# Patient Record
Sex: Male | Born: 1940 | Race: Black or African American | Hispanic: No | Smoking: Current every day smoker
Health system: Southern US, Community
[De-identification: ages and names within clinical notes are randomized; demographics above are authoritative.]

## PROBLEM LIST (undated history)

## (undated) DIAGNOSIS — I10 Essential (primary) hypertension: Secondary | ICD-10-CM

---

## 2015-08-10 DIAGNOSIS — Z8673 Personal history of transient ischemic attack (TIA), and cerebral infarction without residual deficits: Secondary | ICD-10-CM

## 2017-03-06 DIAGNOSIS — E7800 Pure hypercholesterolemia, unspecified: Secondary | ICD-10-CM | POA: Diagnosis present

## 2020-12-04 ENCOUNTER — Emergency Department: Payer: Medicare Other

## 2020-12-04 ENCOUNTER — Other Ambulatory Visit: Payer: Self-pay

## 2020-12-04 ENCOUNTER — Observation Stay: Payer: Medicare Other

## 2020-12-04 ENCOUNTER — Inpatient Hospital Stay
Admission: EM | Admit: 2020-12-04 | Discharge: 2020-12-06 | DRG: 871 | Disposition: A | Discharge status: Home Health | Payer: Medicare Other | Attending: Family Medicine | Admitting: Family Medicine

## 2020-12-04 DIAGNOSIS — Z8673 Personal history of transient ischemic attack (TIA), and cerebral infarction without residual deficits: Secondary | ICD-10-CM

## 2020-12-04 DIAGNOSIS — A419 Sepsis, unspecified organism: Secondary | ICD-10-CM | POA: Diagnosis not present

## 2020-12-04 DIAGNOSIS — N390 Urinary tract infection, site not specified: Secondary | ICD-10-CM

## 2020-12-04 DIAGNOSIS — Z20822 Contact with and (suspected) exposure to covid-19: Secondary | ICD-10-CM | POA: Diagnosis present

## 2020-12-04 DIAGNOSIS — J189 Pneumonia, unspecified organism: Secondary | ICD-10-CM | POA: Diagnosis not present

## 2020-12-04 DIAGNOSIS — I10 Essential (primary) hypertension: Secondary | ICD-10-CM | POA: Diagnosis present

## 2020-12-04 DIAGNOSIS — E119 Type 2 diabetes mellitus without complications: Secondary | ICD-10-CM | POA: Diagnosis present

## 2020-12-04 DIAGNOSIS — I69311 Memory deficit following cerebral infarction: Secondary | ICD-10-CM

## 2020-12-04 DIAGNOSIS — R29818 Other symptoms and signs involving the nervous system: Secondary | ICD-10-CM

## 2020-12-04 DIAGNOSIS — E7800 Pure hypercholesterolemia, unspecified: Secondary | ICD-10-CM

## 2020-12-04 DIAGNOSIS — R531 Weakness: Secondary | ICD-10-CM

## 2020-12-04 DIAGNOSIS — E538 Deficiency of other specified B group vitamins: Secondary | ICD-10-CM | POA: Diagnosis present

## 2020-12-04 DIAGNOSIS — W19XXXA Unspecified fall, initial encounter: Secondary | ICD-10-CM | POA: Diagnosis present

## 2020-12-04 DIAGNOSIS — I69320 Aphasia following cerebral infarction: Secondary | ICD-10-CM

## 2020-12-04 DIAGNOSIS — E785 Hyperlipidemia, unspecified: Secondary | ICD-10-CM | POA: Diagnosis present

## 2020-12-04 DIAGNOSIS — H5462 Unqualified visual loss, left eye, normal vision right eye: Secondary | ICD-10-CM | POA: Diagnosis present

## 2020-12-04 DIAGNOSIS — F0150 Vascular dementia without behavioral disturbance: Secondary | ICD-10-CM | POA: Diagnosis present

## 2020-12-04 DIAGNOSIS — I69351 Hemiplegia and hemiparesis following cerebral infarction affecting right dominant side: Secondary | ICD-10-CM

## 2020-12-04 DIAGNOSIS — R4182 Altered mental status, unspecified: Secondary | ICD-10-CM

## 2020-12-04 DIAGNOSIS — I248 Other forms of acute ischemic heart disease: Secondary | ICD-10-CM | POA: Diagnosis present

## 2020-12-04 DIAGNOSIS — F17210 Nicotine dependence, cigarettes, uncomplicated: Secondary | ICD-10-CM | POA: Diagnosis present

## 2020-12-04 DIAGNOSIS — I639 Cerebral infarction, unspecified: Secondary | ICD-10-CM | POA: Diagnosis not present

## 2020-12-04 DIAGNOSIS — H409 Unspecified glaucoma: Secondary | ICD-10-CM | POA: Diagnosis present

## 2020-12-04 HISTORY — DX: Essential (primary) hypertension: I10

## 2020-12-04 LAB — CBC WITH DIFFERENTIAL/PLATELET
Abs Immature Granulocytes: 0.06 10*3/uL (ref 0.00–0.07)
Basophils Absolute: 0 10*3/uL (ref 0.0–0.1)
Basophils Relative: 0 %
Eosinophils Absolute: 0 10*3/uL (ref 0.0–0.5)
Eosinophils Relative: 0 %
HCT: 45.9 % (ref 39.0–52.0)
Hemoglobin: 15.3 g/dL (ref 13.0–17.0)
Immature Granulocytes: 1 %
Lymphocytes Relative: 19 %
Lymphs Abs: 2 10*3/uL (ref 0.7–4.0)
MCH: 30.1 pg (ref 26.0–34.0)
MCHC: 33.3 g/dL (ref 30.0–36.0)
MCV: 90.4 fL (ref 80.0–100.0)
Monocytes Absolute: 1.2 10*3/uL — ABNORMAL HIGH (ref 0.1–1.0)
Monocytes Relative: 11 %
Neutro Abs: 7.5 10*3/uL (ref 1.7–7.7)
Neutrophils Relative %: 69 %
Platelets: 212 10*3/uL (ref 150–400)
RBC: 5.08 MIL/uL (ref 4.22–5.81)
RDW: 14.3 % (ref 11.5–15.5)
WBC: 10.8 10*3/uL — ABNORMAL HIGH (ref 4.0–10.5)
nRBC: 0 % (ref 0.0–0.2)

## 2020-12-04 LAB — URINALYSIS, COMPLETE (UACMP) WITH MICROSCOPIC
Bilirubin Urine: NEGATIVE
Glucose, UA: NEGATIVE mg/dL
Ketones, ur: 20 mg/dL — AB
Nitrite: NEGATIVE
Protein, ur: 100 mg/dL — AB
Specific Gravity, Urine: 1.028 (ref 1.005–1.030)
pH: 5 (ref 5.0–8.0)

## 2020-12-04 LAB — COMPREHENSIVE METABOLIC PANEL
ALT: 15 U/L (ref 0–44)
AST: 34 U/L (ref 15–41)
Albumin: 3.8 g/dL (ref 3.5–5.0)
Alkaline Phosphatase: 58 U/L (ref 38–126)
Anion gap: 12 (ref 5–15)
BUN: 27 mg/dL — ABNORMAL HIGH (ref 8–23)
CO2: 24 mmol/L (ref 22–32)
Calcium: 9.7 mg/dL (ref 8.9–10.3)
Chloride: 99 mmol/L (ref 98–111)
Creatinine, Ser: 1.16 mg/dL (ref 0.61–1.24)
GFR, Estimated: >60 mL/min (ref >60)
Glucose, Bld: 95 mg/dL (ref 70–99)
Potassium: 4.2 mmol/L (ref 3.5–5.1)
Sodium: 135 mmol/L (ref 135–145)
Total Bilirubin: 1.4 mg/dL — ABNORMAL HIGH (ref 0.3–1.2)
Total Protein: 8 g/dL (ref 6.5–8.1)

## 2020-12-04 LAB — URINE DRUG SCREEN, QUALITATIVE (ARMC ONLY)
Amphetamines, Ur Screen: NOT DETECTED
Barbiturates, Ur Screen: NOT DETECTED
Benzodiazepine, Ur Scrn: NOT DETECTED
Cannabinoid 50 Ng, Ur ~~LOC~~: NOT DETECTED
Cocaine Metabolite,Ur ~~LOC~~: NOT DETECTED
MDMA (Ecstasy)Ur Screen: NOT DETECTED
Methadone Scn, Ur: NOT DETECTED
Opiate, Ur Screen: NOT DETECTED
Phencyclidine (PCP) Ur S: NOT DETECTED
Tricyclic, Ur Screen: NOT DETECTED

## 2020-12-04 LAB — TROPONIN I (HIGH SENSITIVITY)
Troponin I (High Sensitivity): 25 ng/L — ABNORMAL HIGH (ref <18)
Troponin I (High Sensitivity): 29 ng/L — ABNORMAL HIGH (ref <18)

## 2020-12-04 LAB — RESP PANEL BY RT-PCR (FLU A&B, COVID) ARPGX2
Influenza A by PCR: NEGATIVE
Influenza B by PCR: NEGATIVE
SARS Coronavirus 2 by RT PCR: NEGATIVE

## 2020-12-04 LAB — LACTIC ACID, PLASMA
Lactic Acid, Venous: 2.6 mmol/L (ref 0.5–1.9)
Lactic Acid, Venous: 2.6 mmol/L (ref 0.5–1.9)

## 2020-12-04 LAB — ETHANOL: Alcohol, Ethyl (B): <10 mg/dL (ref <10)

## 2020-12-04 MED ORDER — ASPIRIN EC 81 MG PO TBEC
81.0000 mg | DELAYED_RELEASE_TABLET | Freq: Every day | ORAL | Status: DC
  Administered 2020-12-05 – 2020-12-06 (×2): 81 mg via ORAL
  Filled 2020-12-04 (×2): qty 1

## 2020-12-04 MED ORDER — SODIUM CHLORIDE 0.9 % IV SOLN
2.0000 g | INTRAVENOUS | Status: DC
  Administered 2020-12-04 – 2020-12-05 (×2): 2 g via INTRAVENOUS
  Filled 2020-12-04 (×3): qty 20

## 2020-12-04 MED ORDER — LACTATED RINGERS IV SOLN: INTRAVENOUS | Status: DC

## 2020-12-04 MED ORDER — ACETAMINOPHEN 325 MG PO TABS: 650.0000 mg | ORAL_TABLET | Freq: Four times a day (QID) | ORAL | Status: DC | PRN

## 2020-12-04 MED ORDER — STROKE: EARLY STAGES OF RECOVERY BOOK: Freq: Once | Status: AC

## 2020-12-04 MED ORDER — ALBUTEROL SULFATE (2.5 MG/3ML) 0.083% IN NEBU: 2.5000 mg | INHALATION_SOLUTION | Freq: Four times a day (QID) | RESPIRATORY_TRACT | Status: DC | PRN

## 2020-12-04 MED ORDER — ASPIRIN 325 MG PO TABS
325.0000 mg | ORAL_TABLET | Freq: Once | ORAL | Status: AC
  Administered 2020-12-04: 325 mg via ORAL
  Filled 2020-12-04: qty 1

## 2020-12-04 MED ORDER — SODIUM CHLORIDE 0.9% FLUSH
3.0000 mL | Freq: Two times a day (BID) | INTRAVENOUS | Status: DC
  Administered 2020-12-04 – 2020-12-06 (×4): 3 mL via INTRAVENOUS

## 2020-12-04 MED ORDER — POLYETHYLENE GLYCOL 3350 17 G PO PACK: 17.0000 g | PACK | Freq: Every day | ORAL | Status: DC | PRN

## 2020-12-04 MED ORDER — LACTATED RINGERS IV BOLUS (SEPSIS)
1000.0000 mL | Freq: Once | INTRAVENOUS | Status: AC
  Administered 2020-12-04: 1000 mL via INTRAVENOUS

## 2020-12-04 MED ORDER — ENOXAPARIN SODIUM 40 MG/0.4ML ~~LOC~~ SOLN
40.0000 mg | SUBCUTANEOUS | Status: DC
  Administered 2020-12-04 – 2020-12-05 (×2): 40 mg via SUBCUTANEOUS
  Filled 2020-12-04 (×2): qty 0.4

## 2020-12-04 MED ORDER — ACETAMINOPHEN 650 MG RE SUPP: 650.0000 mg | Freq: Four times a day (QID) | RECTAL | Status: DC | PRN

## 2020-12-04 MED ORDER — SODIUM CHLORIDE 0.9 % IV SOLN
500.0000 mg | INTRAVENOUS | Status: DC
  Administered 2020-12-04 – 2020-12-05 (×2): 500 mg via INTRAVENOUS
  Filled 2020-12-04 (×3): qty 500

## 2020-12-04 NOTE — ED Triage Notes (Signed)
Patient arrived via EMS after unwitnessed fall "a few days" ago per family. Family states he is not acting his baseline which is normally A&Ox4. Per family patient has not been eating. Pt is weak on right side, equal smile, and following commands.

## 2020-12-04 NOTE — H&P (Addendum)
History and Physical   Dustin Peterson LNL:[id number] DOB: [birth year] DOA: 12/04/2020  PCP: Pcp, No   Patient coming from: Home  Chief Complaint: Weakness, change in behavior, recently found down  HPI: Dustin Peterson is a 80 y.o. male with medical history significant of stroke, glaucoma, left eye blindness, hypertension, hyperlipidemia who presents after acting abnormally and being found down recently.  Some history obtained with the assistance of family chart review.  Patient was found down several days ago after a presumed fall versus weakness leading him to be found down.  He has been altered somewhat and has had decreased p.o. intake. Per EMS he was unable to ambulate without assistance.  Earlier in his hospitalization he was reportedly not speaking and this is true for his family before he was hospitalized as well.  Per the ED provider he was speaking some but in a soft voice and preferring to nod or indicate information with his hands.  At the time of my exam patient was able to speak more and answer questions appropriately.  He now states that he was not speaking as much earlier not because he did not want to but because he was having trouble getting the words out.  He also reports after discussion that he has noticed his right side is weaker than his left.  He and family are unsure but thinks that this is different from his previous stroke deficits.  He is also now able to tell me that he did fall on his back several days ago.  Family confirms that he had not mentioned that until now.  Reports some urinary frequency.  Denies dysuria. Denies fevers, chills, chest pain, shortness of breath, abdominal pain, constipation, diarrhea, nausea, vomiting.  ED Course: Vital signs in ED significant for initial heart rate of 100 on EKG with improvement in ED.  Initial respiratory rate in the 20s with improvement in the ED.  Lab work-up with BMP with BUN 27, T bili 1.4.  CBC with leukocytosis to 10.8.   Troponin elevated to 25 with repeat pending.  Lactic acid elevated 2.6 with repeat pending.  Respiratory panel for flu and COVID pending.  Urinalysis with leukocytes, bacteria, white cells, hemoglobin.  UDS negative.  Ethanol level pending.  This x-ray showed chronic ILD changes, mild left basilar atelectasis versus infiltrate.  CT head showed no acute disease but showed small chronic lacunar infarcts.  CT C-spine showed no acute disease but did show severe degenerative disease at C5-C6.  Blood cultures pending.  Patient received 1 L IV fluids, ceftriaxone, azithromycin in ED.  Review of Systems: As per HPI otherwise all other systems reviewed and are negative.  Past Medical History:  Diagnosis Date  . Hypertension     History reviewed. No pertinent surgical history.  Social History  reports that he has been smoking cigarettes. He has been smoking about 1.00 pack per day. He has never used smokeless tobacco. He reports previous alcohol use. He reports that he does not use drugs.  No Known Allergies  Family History  Problem Relation Age of Onset  . Colon cancer Mother   Reviewed on admission  Prior to Admission medications   Not on File  Lipitor 40 mg daily Latanoprost eyedrops both eyes nightly  Physical Exam: Vitals:   12/04/20 1924 12/04/20 1927 12/04/20 1929 12/04/20 2030  BP:  105/86  111/86  Pulse:  98  88  Resp:  (!) 25  19  Temp:  97.6 F (36.4 C)  TempSrc:  Axillary    SpO2: 97% 98%  100%  Weight:   58.8 kg   Height:   5\' 9"  (1.753 m)    Physical Exam Constitutional:      General: He is not in acute distress.    Appearance: Normal appearance.  HENT:     Head: Normocephalic and atraumatic.     Mouth/Throat:     Mouth: Mucous membranes are moist.     Pharynx: Oropharynx is clear.  Eyes:     Extraocular Movements: Extraocular movements intact.     Pupils: Pupils are equal, round, and reactive to light.  Cardiovascular:     Rate and Rhythm: Normal rate and  regular rhythm.     Pulses: Normal pulses.     Heart sounds: Normal heart sounds.  Pulmonary:     Effort: Pulmonary effort is normal. No respiratory distress.     Breath sounds: Normal breath sounds.  Abdominal:     General: Bowel sounds are normal. There is no distension.     Palpations: Abdomen is soft.     Tenderness: There is abdominal tenderness in the suprapubic area.  Musculoskeletal:        General: No swelling or deformity.  Skin:    General: Skin is warm and dry.  Neurological:     Mental Status: Mental status is at baseline.     Comments: Mental Status: Patient is awake, alert, oriented Ha had early evidence of aphasia based on prior providers exam where he was saying minimal words.  He reports that that was because he did not want to speak but because he was having trouble getting words out.  No signs of neglect  Cranial Nerves: II: R Pupil equal, round, and reactive to light.  Chronic left eye blindness with glaucoma. III,IV, VI: EOMI without ptosis or diploplia.  V: Facial sensation is symmetric tolight touch. VII: Facial movement is symmetric.  VIII: hearing is intact to voice X: Uvula elevates symmetrically XI: Shoulder shrug is symmetric. XII: tongue is midline without atrophy or fasciculations.  Motor: Good effort thorughout, 4-5 out of 5 left upper and left lower extremities.  3-4 out of 5 right upper and right lower extremities. Sensory: Sensation is grossly intact bilateral UEs & LE Cerebellar: Finger-Nose mildly positive bilaterally this may be due to his left eye blindness.     Labs on Admission: I have personally reviewed following labs and imaging studies  CBC: Recent Labs  Lab 12/04/20 1935  WBC 10.8*  NEUTROABS 7.5  HGB 15.3  HCT 45.9  MCV 90.4  PLT 212    Basic Metabolic Panel: Recent Labs  Lab 12/04/20 1935  NA 135  K 4.2  CL 99  CO2 24  GLUCOSE 95  BUN 27*  CREATININE 1.16  CALCIUM 9.7    GFR: Estimated Creatinine  Clearance: 41.5 mL/min (by C-G formula based on SCr of 1.16 mg/dL).  Liver Function Tests: Recent Labs  Lab 12/04/20 1935  AST 34  ALT 15  ALKPHOS 58  BILITOT 1.4*  PROT 8.0  ALBUMIN 3.8    Urine analysis:    Component Value Date/Time   COLORURINE AMBER (A) 12/04/2020 1935   APPEARANCEUR HAZY (A) 12/04/2020 1935   LABSPEC 1.028 12/04/2020 1935   PHURINE 5.0 12/04/2020 1935   GLUCOSEU NEGATIVE 12/04/2020 1935   HGBUR MODERATE (A) 12/04/2020 1935   BILIRUBINUR NEGATIVE 12/04/2020 1935   KETONESUR 20 (A) 12/04/2020 1935   PROTEINUR 100 (A) 12/04/2020 1935   NITRITE  NEGATIVE 12/04/2020 1935   LEUKOCYTESUR SMALL (A) 12/04/2020 1935    Radiological Exams on Admission: CT Head Wo Contrast  Result Date: 12/04/2020 CLINICAL DATA:  Status post fall. EXAM: CT HEAD WITHOUT CONTRAST TECHNIQUE: Contiguous axial images were obtained from the base of the skull through the vertex without intravenous contrast. COMPARISON:  None. FINDINGS: Brain: There is mild to moderate severity cerebral atrophy with widening of the extra-axial spaces and ventricular dilatation. There are areas of decreased attenuation within the white matter tracts of the supratentorial brain, consistent with microvascular disease changes. A small chronic left basal ganglia lacunar infarct is seen. Vascular: No hyperdense vessel or unexpected calcification. Skull: Normal. Negative for fracture or focal lesion. Sinuses/Orbits: There is mild right maxillary sinus mucosal thickening. Other: None. IMPRESSION: 1. Generalized cerebral atrophy. 2. Small chronic left basal ganglia lacunar infarct. 3. No acute intracranial abnormality. Electronically Signed   By: Aram Candelahaddeus  Houston M.D.   On: 12/04/2020 20:14   CT Cervical Spine Wo Contrast  Result Date: 12/04/2020 CLINICAL DATA:  Status post trauma. EXAM: CT CERVICAL SPINE WITHOUT CONTRAST TECHNIQUE: Multidetector CT imaging of the cervical spine was performed without intravenous  contrast. Multiplanar CT image reconstructions were also generated. COMPARISON:  None. FINDINGS: Alignment: Normal. Skull base and vertebrae: No acute fracture. Chronic changes are seen involving the body and tip of the dens. No primary bone lesion or focal pathologic process. Soft tissues and spinal canal: No prevertebral fluid or swelling. No visible canal hematoma. Disc levels: Mild anterior osteophyte formation is seen at the level of C6-C7. There is moderate to marked severity endplate sclerosis at the level of C5-C6. There is marked severity narrowing of the anterior atlantoaxial articulation. Moderate to marked severity intervertebral disc space narrowing is seen at the level of C5-C6. Bilateral, mild-to-moderate severity multilevel facet joint hypertrophy is noted. Upper chest: Mild atelectasis is seen within the left apex. Other: None. IMPRESSION: 1. Moderate to marked severity degenerative changes at the level of C5-C6. 2. No evidence of an acute fracture or subluxation. Electronically Signed   By: Aram Candelahaddeus  Houston M.D.   On: 12/04/2020 20:18   DG Chest Port 1 View  Result Date: 12/04/2020 CLINICAL DATA:  Status post fall. EXAM: PORTABLE CHEST 1 VIEW COMPARISON:  None. FINDINGS: Diffuse, chronic appearing increased interstitial lung markings are seen, bilaterally. Mild left basilar atelectasis and/or infiltrate is also seen. There is no evidence of a pleural effusion or pneumothorax. The heart size and mediastinal contours are within normal limits. The visualized skeletal structures are unremarkable. IMPRESSION: 1. Chronic interstitial lung disease with mild left basilar atelectasis and/or infiltrate. Electronically Signed   By: Aram Candelahaddeus  Houston M.D.   On: 12/04/2020 20:27    EKG: Independently reviewed.  Sinus tachycardia 100 bpm.  Assessment/Plan Principal Problem:   Acute CVA (cerebrovascular accident) [facility](HCC) Active Problems:   Sepsis (HCC)   History of CVA (cerebrovascular accident)    Pure hypercholesterolemia   CAP (community acquired pneumonia)   Acute lower UTI  Stroke vs TIA Fall HLD > Right-sided weakness, aphasia, abnormal finger-nose test > Patient's ability to speak is improved since has been in the ED and he states that he was having trouble getting out his words earlier and that was the reason he was communicating less not that he did not want to. > He has had a stroke previously with evidence on CT of a chronic infarct but no acute hemorrhagic stroke.  He and family states that his deficits at that time were different  from these. > He was able to clarify on my exam but he did have a fall several days ago where he fell backwards per his report. - Neurology consult in the AM - Allow for permissive HTN in the setting of systolic < 220 and diastolic < 120 - ASA 325 mg / 81 mg daily  - Continue home Statin  - Echocardiogram  - Carotid doppler  - A1C  - Lipid panel  - Tele monitoring  - SLP eval - PT/OT  Sepsis UTI versus Pneumonia > Some of his vital abnormalities and weakness may be due to stroke as above versus sepsis. > Noted to have leukocytosis to 10.8 in ED.  Chest x-ray showed mild left basilar atelectasis first infiltrate.  Urinalysis with leukocytes bacteria white cells and hemoglobin. > Meets sepsis criteria considering leukocytosis to 10.8, initial tachycardia at 100 and initial respirations in the 20s with lactic acid 2.6.  Blood pressure stable in ED. > No dysuria but does report increased urinary frequency and has suprapubic tenderness on exam. - Continue ceftriaxone and azithromycin - Continue IV fluids - Follow-up for repeat blood cultures - Trend fever curve and WBC  History of CVA Hyperlipidemia - Continue home Lipitor  Left eye blindness Glaucoma - Continue home latanoprost eyedrops  DVT prophylaxis: Lovenox  Code Status:   Full Family Communication:  Family updated at bedside  Disposition Plan:   Patient is  from:  Home  Anticipated DC to:  Pending evaluation  Anticipated DC date:  1 to 3 days  Anticipated DC barriers: None  Consults called:  None  Admission status:  Observation, progressive  Severity of Illness: The appropriate patient status for this patient is OBSERVATION. Observation status is judged to be reasonable and necessary in order to provide the required intensity of service to ensure the patient's safety. The patient's presenting symptoms, physical exam findings, and initial radiographic and laboratory data in the context of their medical condition is felt to place them at decreased risk for further clinical deterioration. Furthermore, it is anticipated that the patient will be medically stable for discharge from the hospital within 2 midnights of admission. The following factors support the patient status of observation.   " The patient's presenting symptoms include weakness, decreased p.o. intake, found down earlier this week, later noted to have aphasia and right-sided deficits. " The physical exam findings include right-sided weakness, abnormal finger-nose. " The initial radiographic and laboratory data are leukocytosis 2.8, chest x-ray with mild left basilar atelectasis versus infiltrate, urinalysis with leukocytes, bacteria, white blood cells, hemoglobin.  Initial troponin  25, initial lactic acid 2.6..   Synetta Fail MD [facility]  How to contact the [facility] Attending or Consulting provider 7A - 7P or covering provider during after hours 7P -7A, for this patient?   1. Check the care team in [employer] and look for a) attending/consulting [facility] provider listed and b) the [facility] team listed 2. Log into [email or web address] and use 's universal password to access. If you do not have the password, please contact the hospital operator. 3. Locate the [facility] provider you are looking for under [facility] and page to a number that you can be directly reached. 4. If you still  have difficulty reaching the provider, please page the [facility] (Director on Call) for the Hospitalists listed on amion for assistance.  12/04/2020, 10:03 PM

## 2020-12-04 NOTE — ED Provider Notes (Signed)
[facility] Emergency Department Provider Note  ____________________________________________   Event Date/Time   First MD Initiated Contact with Patient 12/04/20 1927     (approximate)  I have reviewed the triage vital signs and the nursing notes.   HISTORY  Chief Complaint Fall   HPI Dustin Peterson is a 80 y.o. male is brought to the ED via the EMS with complaint of a unwitnessed fall approximately 2 days ago.  Family told EMS that he is not "acting right".  Patient also is not eating or drinking normally.  Patient denies any pain, nausea or vomiting.  Patient admits to smoking daily but denies any use of alcohol or drugs.  EMS reports also that patient was unable to ambulate with assistance.  EMS reports that family is on the way.  History is obtained with patient giving head nods and use of fingers to say how many days it has been since he ate or drank.  He answers that is been 3 days since he ate but had a small amount of Pepsi today.  When asked about eating he nods yes to he has no appetite but that there is food there.       Past Medical History:  Diagnosis Date  . Hypertension     There are no problems to display for this patient.   Prior to Admission medications   Not on File    Allergies Patient has no known allergies.  History reviewed. No pertinent family history.  Social History Social History   Tobacco Use  . Smoking status: Current Every Day Smoker    Packs/day: 1.00    Types: Cigarettes  . Smokeless tobacco: Never Used  Substance Use Topics  . Alcohol use: Not Currently  . Drug use: Never    Review of Systems Constitutional: No fever/chills.  Denies dizziness. Eyes: No visual changes. ENT: Denies sore throat. Cardiovascular: Denies chest pain. Respiratory: Denies shortness of breath. Gastrointestinal: No abdominal pain.  No nausea, no vomiting.  No diarrhea.   Genitourinary: Negative for dysuria. Musculoskeletal:   denies musculoskeletal pain.  Negative for back pain. Skin: Negative for rash.  Neurological: Negative for headaches, focal weakness or numbness.   ____________________________________________   PHYSICAL EXAM:  VITAL SIGNS: ED Triage Vitals [12/04/20 1924]  Enc Vitals Group     BP      Pulse      Resp      Temp      Temp src      SpO2 97 %     Weight      Height      Head Circumference      Peak Flow      Pain Score      Pain Loc      Pain Edu?      Excl. in GC?     Constitutional: Alert and oriented. Well appearing and in no acute distress.  Patient with minimal verbalization but answers questions with nods and also uses fingers to answer questions. Eyes: Right conjunctivae are normal. PERRL. EOMI.  Head: Atraumatic.  No abrasions, lacerations or soft tissue edema present. Nose: No trauma noted. Mouth/Throat: No trauma.  Teeth are in very poor repair and hygiene. Neck: No stridor.  No tenderness on palpation of cervical spine posteriorly. Cardiovascular: Normal rate, regular rhythm. Grossly normal heart sounds.  Good peripheral circulation. Respiratory: Normal respiratory effort.  No retractions. Lungs CTAB.  Poor deep inspiration attempts. Gastrointestinal: Soft and nontender. No distention.  Musculoskeletal: Patient is able move upper and lower extremities.  He has good hand strength and lower leg strength.  No injury, skin discoloration or abrasions are noted.  He denies any tenderness on palpation of the thoracic or lumbar spine.  No tenderness with compression of the hips and no rotation or shortening of extremities is noted.  No edema noted lower extremities. Neurologic:  Normal speech and language.  Cranial nerves II through XII grossly intact.  No gross focal neurologic deficits are appreciated. No gait instability. Skin:  Skin is warm, dry and intact.  No discoloration or abrasions are noted. Psychiatric: Mood and affect are normal. Speech and behavior are  normal.  ____________________________________________   LABS (all labs ordered are listed, but only abnormal results are displayed)  Labs Reviewed  CBC WITH DIFFERENTIAL/PLATELET - Abnormal; Notable for the following components:      Result Value   WBC 10.8 (*)    Monocytes Absolute 1.2 (*)    All other components within normal limits  LACTIC ACID, PLASMA - Abnormal; Notable for the following components:   Lactic Acid, Venous 2.6 (*)    All other components within normal limits  COMPREHENSIVE METABOLIC PANEL  URINALYSIS, COMPLETE (UACMP) WITH MICROSCOPIC  LACTIC ACID, PLASMA  URINE DRUG SCREEN, QUALITATIVE ([facility] ONLY)  ETHANOL  TROPONIN I (HIGH SENSITIVITY)   ____________________________________________  EKG   ____________________________________________  RADIOLOGY Beaulah Corin, personally viewed and evaluated these images (plain radiographs) as part of my medical decision making, as well as reviewing the written report by the radiologist.  ED MD interpretation:    Official radiology report(s): No results found.  ____________________________________________   PROCEDURES  Procedure(s) performed (including Critical Care):  Procedures   ____________________________________________   INITIAL IMPRESSION / ASSESSMENT AND PLAN / ED COURSE  As part of my medical decision making, I reviewed the following data within the electronic MEDICAL RECORD NUMBER Notes from prior ED visits and [place]rolled Sub[place] Database  ----------------------------------------- 8:07 PM on 12/04/2020 ----------------------------------------- Lab work is pending.  No medical records prior to this visit is available at [facility], [facility] or [facility].  Care of this patient is being turned over to Dr. Donna Bernard at this time.  He is aware of work up in process.     ____________________________________________   FINAL CLINICAL IMPRESSION(S) / ED DIAGNOSES  Final diagnoses:  None     ED  Discharge Orders    None      *Please note:  Grove Defina was evaluated in Emergency Department on 12/04/2020 for the symptoms described in the history of present illness. He was evaluated in the context of the global COVID-19 pandemic, which necessitated consideration that the patient might be at risk for infection with the SARS-CoV-2 virus that causes COVID-19. Institutional protocols and algorithms that pertain to the evaluation of patients at risk for COVID-19 are in a state of rapid change based on information released by regulatory bodies including the CDC and federal and state organizations. These policies and algorithms were followed during the patient's care in the ED.  Some ED evaluations and interventions may be delayed as a result of limited staffing during and the pandemic.*   Note:  This document was prepared using Dragon voice recognition software and may include unintentional dictation errors.    Tommi Rumps, PA-C 12/07/20 1009    Merwyn Katos, MD 12/08/20 [phone]

## 2020-12-04 NOTE — Consult Note (Signed)
CODE SEPSIS - PHARMACY COMMUNICATION  **Broad Spectrum Antibiotics should be administered within 1 hour of Sepsis diagnosis**  Time Code Sepsis Called/Page Received: 2055  Antibiotics Ordered: 2052  Time of 1st antibiotic administration: 2109  Additional action taken by pharmacy: N/A  If necessary, Name of Provider/Nurse Contacted: N/A    Derrek Gu ,PharmD Clinical Pharmacist  12/04/2020  8:54 PM

## 2020-12-04 NOTE — Progress Notes (Signed)
Elink following for Sepsis Protocol 

## 2020-12-05 ENCOUNTER — Observation Stay (HOSPITAL_COMMUNITY)
Admit: 2020-12-05 | Discharge: 2020-12-05 | Disposition: A | Discharge status: Home / Self Care | Payer: Medicare Other | Attending: Internal Medicine | Admitting: Internal Medicine

## 2020-12-05 ENCOUNTER — Observation Stay: Payer: Medicare Other

## 2020-12-05 DIAGNOSIS — H5462 Unqualified visual loss, left eye, normal vision right eye: Secondary | ICD-10-CM | POA: Diagnosis present

## 2020-12-05 DIAGNOSIS — R531 Weakness: Secondary | ICD-10-CM

## 2020-12-05 DIAGNOSIS — I69311 Memory deficit following cerebral infarction: Secondary | ICD-10-CM | POA: Diagnosis not present

## 2020-12-05 DIAGNOSIS — H409 Unspecified glaucoma: Secondary | ICD-10-CM | POA: Diagnosis present

## 2020-12-05 DIAGNOSIS — Z20822 Contact with and (suspected) exposure to covid-19: Secondary | ICD-10-CM | POA: Diagnosis present

## 2020-12-05 DIAGNOSIS — F17210 Nicotine dependence, cigarettes, uncomplicated: Secondary | ICD-10-CM | POA: Diagnosis present

## 2020-12-05 DIAGNOSIS — I248 Other forms of acute ischemic heart disease: Secondary | ICD-10-CM | POA: Diagnosis present

## 2020-12-05 DIAGNOSIS — E538 Deficiency of other specified B group vitamins: Secondary | ICD-10-CM | POA: Diagnosis present

## 2020-12-05 DIAGNOSIS — I6389 Other cerebral infarction: Secondary | ICD-10-CM

## 2020-12-05 DIAGNOSIS — I69320 Aphasia following cerebral infarction: Secondary | ICD-10-CM | POA: Diagnosis not present

## 2020-12-05 DIAGNOSIS — I10 Essential (primary) hypertension: Secondary | ICD-10-CM | POA: Diagnosis present

## 2020-12-05 DIAGNOSIS — R29818 Other symptoms and signs involving the nervous system: Secondary | ICD-10-CM | POA: Diagnosis present

## 2020-12-05 DIAGNOSIS — F0150 Vascular dementia without behavioral disturbance: Secondary | ICD-10-CM | POA: Diagnosis present

## 2020-12-05 DIAGNOSIS — A419 Sepsis, unspecified organism: Secondary | ICD-10-CM | POA: Diagnosis present

## 2020-12-05 DIAGNOSIS — E119 Type 2 diabetes mellitus without complications: Secondary | ICD-10-CM | POA: Diagnosis present

## 2020-12-05 DIAGNOSIS — E785 Hyperlipidemia, unspecified: Secondary | ICD-10-CM | POA: Diagnosis present

## 2020-12-05 DIAGNOSIS — J189 Pneumonia, unspecified organism: Secondary | ICD-10-CM | POA: Diagnosis present

## 2020-12-05 DIAGNOSIS — W19XXXA Unspecified fall, initial encounter: Secondary | ICD-10-CM | POA: Diagnosis present

## 2020-12-05 DIAGNOSIS — I639 Cerebral infarction, unspecified: Secondary | ICD-10-CM | POA: Diagnosis not present

## 2020-12-05 DIAGNOSIS — I69351 Hemiplegia and hemiparesis following cerebral infarction affecting right dominant side: Secondary | ICD-10-CM | POA: Diagnosis not present

## 2020-12-05 LAB — LACTIC ACID, PLASMA
Lactic Acid, Venous: 1.6 mmol/L (ref 0.5–1.9)
Lactic Acid, Venous: 1.3 mmol/L (ref 0.5–1.9)

## 2020-12-05 LAB — PROTIME-INR
INR: 1.2 (ref 0.8–1.2)
Prothrombin Time: 15.1 seconds (ref 11.4–15.2)

## 2020-12-05 LAB — CBC
HCT: 36.8 % — ABNORMAL LOW (ref 39.0–52.0)
Hemoglobin: 12.2 g/dL — ABNORMAL LOW (ref 13.0–17.0)
MCH: 30.5 pg (ref 26.0–34.0)
MCHC: 33.2 g/dL (ref 30.0–36.0)
MCV: 92 fL (ref 80.0–100.0)
Platelets: 166 10*3/uL (ref 150–400)
RBC: 4 MIL/uL — ABNORMAL LOW (ref 4.22–5.81)
RDW: 14.2 % (ref 11.5–15.5)
WBC: 7.4 10*3/uL (ref 4.0–10.5)
nRBC: 0 % (ref 0.0–0.2)

## 2020-12-05 LAB — HEMOGLOBIN A1C
Hgb A1c MFr Bld: 5.5 % (ref 4.8–5.6)
Mean Plasma Glucose: 111.15 mg/dL

## 2020-12-05 LAB — COMPREHENSIVE METABOLIC PANEL
ALT: 10 U/L (ref 0–44)
AST: 21 U/L (ref 15–41)
Albumin: 2.9 g/dL — ABNORMAL LOW (ref 3.5–5.0)
Alkaline Phosphatase: 45 U/L (ref 38–126)
Anion gap: 10 (ref 5–15)
BUN: 24 mg/dL — ABNORMAL HIGH (ref 8–23)
CO2: 26 mmol/L (ref 22–32)
Calcium: 8.6 mg/dL — ABNORMAL LOW (ref 8.9–10.3)
Chloride: 101 mmol/L (ref 98–111)
Creatinine, Ser: 0.93 mg/dL (ref 0.61–1.24)
GFR, Estimated: >60 mL/min (ref >60)
Glucose, Bld: 79 mg/dL (ref 70–99)
Potassium: 3.6 mmol/L (ref 3.5–5.1)
Sodium: 137 mmol/L (ref 135–145)
Total Bilirubin: 0.9 mg/dL (ref 0.3–1.2)
Total Protein: 5.9 g/dL — ABNORMAL LOW (ref 6.5–8.1)

## 2020-12-05 LAB — ECHOCARDIOGRAM COMPLETE
AR max vel: 3.12 cm2
AV Area VTI: 3.44 cm2
AV Area mean vel: 2.97 cm2
AV Mean grad: 4 mmHg
AV Peak grad: 6.4 mmHg
Ao pk vel: 1.26 m/s
Area-P 1/2: 2.5 cm2
Height: 69 in
MV VTI: 3.04 cm2
S' Lateral: 1.8 cm
Weight: 2074.09 oz

## 2020-12-05 LAB — LIPID PANEL
Cholesterol: 177 mg/dL (ref 0–200)
HDL: 40 mg/dL — ABNORMAL LOW (ref >40)
LDL Cholesterol: 118 mg/dL — ABNORMAL HIGH (ref 0–99)
Total CHOL/HDL Ratio: 4.4 RATIO
Triglycerides: 94 mg/dL (ref <150)
VLDL: 19 mg/dL (ref 0–40)

## 2020-12-05 NOTE — Evaluation (Signed)
Physical Therapy Evaluation Patient Details Name: Dustin Peterson MRN: [id number] DOB: [birth year] Today's Date: 12/05/2020   History of Present Illness  Dustin Peterson is a 80 y.o. male with medical history significant of stroke, glaucoma, left eye blindness, hypertension, hyperlipidemia who presents after acting abnormally and being found down recently. He has been altered somewhat and has had decreased p.o. intake.    Clinical Impression  Patient received in bed, daughter at bedside. Patient agreeable to PT session. Daughter reports he is usually on couch at home and does not do a lot of moving around. He is mod independent with bed mobility, transfers with min assist. Patient ambulated 35 feet with RW. Cues for staying close to AD. Patient will continue to benefit from skilled PT while here to improve strength and functional independence.      Follow Up Recommendations Home health PT;Supervision for mobility/OOB    Equipment Recommendations  Rolling walker with 5" wheels (( he may have one))    Recommendations for Other Services       Precautions / Restrictions Precautions Precautions: Fall Restrictions Weight Bearing Restrictions: No      Mobility  Bed Mobility Overal bed mobility: Modified Independent Bed Mobility: Supine to Sit;Sit to Supine     Supine to sit: Modified independent (Device/Increase time) Sit to supine: Modified independent (Device/Increase time)        Transfers Overall transfer level: Needs assistance Equipment used: Rolling walker (2 wheeled) Transfers: Sit to/from Stand Sit to Stand: Min assist        Lateral/Scoot Transfers: Supervision General transfer comment: Patient requires min assist for sit to stand. Initial standing balance.  Ambulation/Gait Ambulation/Gait assistance: Min guard Gait Distance (Feet): 35 Feet Assistive device: Rolling walker (2 wheeled) Gait Pattern/deviations: Step-through pattern;Decreased step length -  right;Decreased step length - left;Shuffle Gait velocity: decr   General Gait Details: Patient requires cues to stay close to RW due to weak LEs. No lob with ambulation, fatigues and wants to return to room after about 15 feet.  Stairs            Wheelchair Mobility    Modified Rankin (Stroke Patients Only)       Balance Overall balance assessment: Needs assistance Sitting-balance support: Feet supported Sitting balance-Leahy Scale: Good     Standing balance support: Bilateral upper extremity supported;During functional activity Standing balance-Leahy Scale: Fair Standing balance comment: requires BUE support                             Pertinent Vitals/Pain Pain Assessment: No/denies pain    Home Living Family/patient expects to be discharged to:: Private residence Living Arrangements: Alone Available Help at Discharge: Family;Available PRN/intermittently Type of Home: Mobile home Home Access: Stairs to enter Entrance Stairs-Rails: Left;Right;Can reach both Entrance Stairs-Number of Steps: 5 Home Layout: One level Home Equipment: Cane - single point;Shower seat      Prior Function Level of Independence: Independent with assistive device(s)         Comments: Pt reports 1 fall on Friday, denies prior falls. Pt is limited household ambulator using SPC, assist from children for IADLs ( Daughter present for PT session. Reports her father is always on the couch. Does not do much in the way of mobility.)     Hand Dominance        Extremity/Trunk Assessment   Upper Extremity Assessment Upper Extremity Assessment: Defer to OT evaluation    Lower Extremity Assessment Lower  Extremity Assessment: Generalized weakness    Cervical / Trunk Assessment Cervical / Trunk Assessment: Normal  Communication   Communication: No difficulties  Cognition Arousal/Alertness: Awake/alert Behavior During Therapy: WFL for tasks assessed/performed Overall  Cognitive Status: Within Functional Limits for tasks assessed                                        General Comments      Exercises Other Exercises Other Exercises: Pt educated re: OT role, DME recs, d/c recs, falls prevention, ECS Other Exercises: LBD, sup<>sit, lateral scoot, sit<>stand, sitting/standing balance/tolerance   Assessment/Plan    PT Assessment Patient needs continued PT services  PT Problem List Decreased strength;Decreased activity tolerance;Decreased balance;Decreased mobility;Decreased safety awareness       PT Treatment Interventions DME instruction;Therapeutic exercise;Gait training;Balance training;Stair training;Functional mobility training;Therapeutic activities;Patient/family education    PT Goals (Current goals can be found in the Care Plan section)  Acute Rehab PT Goals Patient Stated Goal: planning to stay with daughter at discharge PT Goal Formulation: With patient/family Time For Goal Achievement: 12/12/20 Potential to Achieve Goals: Good    Frequency Min 2X/week   Barriers to discharge Decreased caregiver support      Co-evaluation               AM-PAC PT "6 Clicks" Mobility  Outcome Measure Help needed turning from your back to your side while in a flat bed without using bedrails?: None Help needed moving from lying on your back to sitting on the side of a flat bed without using bedrails?: A Little Help needed moving to and from a bed to a chair (including a wheelchair)?: A Little Help needed standing up from a chair using your arms (e.g., wheelchair or bedside chair)?: A Little Help needed to walk in hospital room?: A Little Help needed climbing 3-5 steps with a railing? : A Little 6 Click Score: 19    End of Session   Activity Tolerance: Patient tolerated treatment well;Patient limited by fatigue Patient left: in bed;with call bell/phone within reach;with bed alarm set;with family/visitor present Nurse  Communication: Mobility status PT Visit Diagnosis: Muscle weakness (generalized) (M62.81);Difficulty in walking, not elsewhere classified (R26.2);History of falling (Z91.81)    Time: 1045-1100 PT Time Calculation (min) (ACUTE ONLY): 15 min   Charges:   PT Evaluation $PT Eval Moderate Complexity: 1 Mod          River Mckercher, PT, GCS 12/05/20,11:40 AM

## 2020-12-05 NOTE — Progress Notes (Addendum)
PROGRESS NOTE    Dustin Peterson  TMH:[id number] DOB: [birth year] DOA: 12/04/2020 PCP: Pcp, No     Brief Narrative:  Dustin Peterson is an 80 year old male with past medical history significant for left-sided stroke, glaucoma, left eye blindness, hyperlipidemia, hypertension who presents with weakness, found down at home several days ago.  Per report, patient was found down several days ago after a presumed fall, has had some confusion and decreased p.o. intake.  Per EMS, patient was unable to ambulate without assistance which is not his baseline.  He had also reported difficulty with his speech, right-sided weakness.  MRI brain was negative for acute stroke.  New events last 24 hours / Subjective: Patient had just finished working with OT, ambulated around the room using a walker.  Patient without any acute complaints, denied cough, although had coughing during my examination later on, denied dyspnea or chest pain.  Denied any nausea, vomiting or dysuria.  Assessment & Plan:   Principal Problem:   Weakness Active Problems:   Sepsis (HCC)   History of CVA (cerebrovascular accident)   Pure hypercholesterolemia   CAP (community acquired pneumonia)   Acute lower UTI   Acute on chronic right-sided weakness Hx CVA  -Per chart review, it appears the patient was admitted in July 2013 at [facility] with a stroke.  At that time, he had had left-sided stroke with right-sided weakness -CT head negative for acute intracranial abnormality -MRI brain without acute intracranial abnormality.  He does have a chronic left MCA infarct with underlying moderately advanced cerebral atrophy, chronic small vessel ischemic disease -Seems to be resolved and back to his baseline this morning. Could be worsening of residual weakness from previous stroke in setting of acute infectious process  -Continue aspirin  -PT OT   Sepsis secondary to left-sided pneumonia -Sepsis present on admission with HR 98, RR 25, WBC 10.8,  lactic acid 2.6 -Chest x-ray revealed chronic interstitial lung disease with mild left basilar atelectasis and/or infiltrate -He was started on Rocephin, azithromycin -Leukocytosis resolved, lactic acidosis resolved -Negative for Covid, influenza  Abnormal UA -Without symptoms of dysuria -Urine culture pending -On Rocephin as above  Falls -PT OT recommending home health.  Daughter states that she will take him home and he will be able to have 24-hour supervision at her house  Demand ischemia -Troponin 25 --> 29   DVT prophylaxis:  enoxaparin (LOVENOX) injection 40 mg Start: 12/04/20 2200  Code Status: Full code Family Communication: Daughter at bedside Disposition Plan:  Status is: Observation  The patient will require care spanning > 2 midnights and should be moved to inpatient because: IV treatments appropriate due to intensity of illness or inability to take PO  Dispo: The patient is from: Home              Anticipated d/c is to: Home              Patient currently is not medically stable to d/c.    Difficult to place patient No      Antimicrobials:  Anti-infectives (From admission, onward)   Start     Dose/Rate Route Frequency Ordered Stop   12/04/20 2100  cefTRIAXone (ROCEPHIN) 2 g in sodium chloride 0.9 % 100 mL IVPB        2 g 200 mL/hr over 30 Minutes Intravenous Every 24 hours 12/04/20 2052     12/04/20 2100  azithromycin (ZITHROMAX) 500 mg in sodium chloride 0.9 % 250 mL IVPB  500 mg 250 mL/hr over 60 Minutes Intravenous Every 24 hours 12/04/20 2052          Objective: Vitals:   12/04/20 2230 12/04/20 2255 12/05/20 0500 12/05/20 0900  BP: 124/82 128/84 120/71 124/82  Pulse: 77 86 60 60  Resp: (!) Temp:  98.8 F (37.1 C) 97.8 F (36.6 C) 98 F (36.7 C)  TempSrc:  Oral Oral Oral  SpO2: 100% 100% 100%   Weight:      Height:        Intake/Output Summary (Last 24 hours) at 12/05/2020 1224 Last data filed at 12/05/2020  0932 Gross per 24 hour  Intake 1080.45 ml  Output 200 ml  Net 880.45 ml   Filed Weights   12/04/20 1929  Weight: 58.8 kg    Examination:  General exam: Appears calm and comfortable, disheveled  Respiratory system: Clear to auscultation. Respiratory effort normal. No respiratory distress. No conversational dyspnea. On room air   Cardiovascular system: S1 & S2 heard, RRR. No murmurs. No pedal edema. Gastrointestinal system: Abdomen is nondistended, soft and nontender. Normal bowel sounds heard. Central nervous system: Alert and oriented. No focal neurological deficits. Speech clear. Strength 5/5 all extremities  Extremities: Symmetric in appearance  Skin: No rashes, lesions or ulcers on exposed skin  Psychiatry: Judgement and insight appear normal. Mood & affect appropriate.   Data Reviewed: I have personally reviewed following labs and imaging studies  CBC: Recent Labs  Lab 12/04/20 1935 12/05/20 0536  WBC 10.8* 7.4  NEUTROABS 7.5  --   HGB 15.3 12.2*  HCT 45.9 36.8*  MCV 90.4 92.0  PLT 212 166   Basic Metabolic Panel: Recent Labs  Lab 12/04/20 1935 12/05/20 0536  NA 135 137  K 4.2 3.6  CL 99 101  CO2 24 26  GLUCOSE 95 79  BUN 27* 24*  CREATININE 1.16 0.93  CALCIUM 9.7 8.6*   GFR: Estimated Creatinine Clearance: 51.8 mL/min (by C-G formula based on SCr of 0.93 mg/dL). Liver Function Tests: Recent Labs  Lab 12/04/20 1935 12/05/20 0536  AST 34 21  ALT 15 10  ALKPHOS 58 45  BILITOT 1.4* 0.9  PROT 8.0 5.9*  ALBUMIN 3.8 2.9*   No results for input(s): LIPASE, AMYLASE in the last 168 hours. No results for input(s): AMMONIA in the last 168 hours. Coagulation Profile: Recent Labs  Lab 12/05/20 0536  INR 1.2   Cardiac Enzymes: No results for input(s): CKTOTAL, CKMB, CKMBINDEX, TROPONINI in the last 168 hours. BNP (last 3 results) No results for input(s): PROBNP in the last 8760 hours. HbA1C: Recent Labs    12/05/20 0536  HGBA1C 5.5   CBG: No  results for input(s): GLUCAP in the last 168 hours. Lipid Profile: Recent Labs    12/05/20 0536  CHOL 177  HDL 40*  LDLCALC 118*  TRIG 94  CHOLHDL 4.4   Thyroid Function Tests: No results for input(s): TSH, T4TOTAL, FREET4, T3FREE, THYROIDAB in the last 72 hours. Anemia Panel: No results for input(s): VITAMINB12, FOLATE, FERRITIN, TIBC, IRON, RETICCTPCT in the last 72 hours. Sepsis Labs: Recent Labs  Lab 12/04/20 1935 12/04/20 2158 12/05/20 0050 12/05/20 0536  LATICACIDVEN 2.6* 2.6* 1.6 1.3    Recent Results (from the past 240 hour(s))  Culture, blood (single)     Status: None (Preliminary result)   Collection Time: 12/04/20  9:04 PM   Specimen: BLOOD  Result Value Ref Range Status   Specimen Description BLOOD RIGHT ANTECUBITAL  Final   Special Requests   Final    BOTTLES DRAWN AEROBIC AND ANAEROBIC Blood Culture adequate volume   Culture   Final    NO GROWTH < 12 HOURS Performed at [facility], [address] [place], [place] [address]    Report Status PENDING  Incomplete  Resp Panel by RT-PCR (Flu A&B, Covid) Nasopharyngeal Swab     Status: None   Collection Time: 12/04/20  9:04 PM   Specimen: Nasopharyngeal Swab; Nasopharyngeal(NP) swabs in vial transport medium  Result Value Ref Range Status   SARS Coronavirus 2 by RT PCR NEGATIVE NEGATIVE Final    Comment: (NOTE) SARS-CoV-2 target nucleic acids are NOT DETECTED.  The SARS-CoV-2 RNA is generally detectable in upper respiratory specimens during the acute phase of infection. The lowest concentration of SARS-CoV-2 viral copies this assay can detect is 138 copies/mL. A negative result does not preclude SARS-Cov-2 infection and should not be used as the sole basis for treatment or other patient management decisions. A negative result may occur with  improper specimen collection/handling, submission of specimen other than nasopharyngeal swab, presence of viral mutation(s) within the areas targeted by  this assay, and inadequate number of viral copies(<138 copies/mL). A negative result must be combined with clinical observations, patient history, and epidemiological information. The expected result is Negative.  Fact Sheet for Patients:  BloggerCourse.com  Fact Sheet for Healthcare Providers:  SeriousBroker.it  This test is no t yet approved or cleared by the [place] FDA and  has been authorized for detection and/or diagnosis of SARS-CoV-2 by FDA under an Emergency Use Authorization (EUA). This EUA will remain  in effect (meaning this test can be used) for the duration of the COVID-19 declaration under Section 564(b)(1) of the Act, 21 U.S.C.section 360bbb-3(b)(1), unless the authorization is terminated  or revoked sooner.       Influenza A by PCR NEGATIVE NEGATIVE Final   Influenza B by PCR NEGATIVE NEGATIVE Final    Comment: (NOTE) The Xpert Xpress SARS-CoV-2/FLU/RSV plus assay is intended as an aid in the diagnosis of influenza from Nasopharyngeal swab specimens and should not be used as a sole basis for treatment. Nasal washings and aspirates are unacceptable for Xpert Xpress SARS-CoV-2/FLU/RSV testing.  Fact Sheet for Patients: BloggerCourse.com  Fact Sheet for Healthcare Providers: SeriousBroker.it  This test is not yet approved or cleared by the [place] FDA and has been authorized for detection and/or diagnosis of SARS-CoV-2 by FDA under an Emergency Use Authorization (EUA). This EUA will remain in effect (meaning this test can be used) for the duration of the COVID-19 declaration under Section 564(b)(1) of the Act, 21 U.S.C. section 360bbb-3(b)(1), unless the authorization is terminated or revoked.  Performed at [facility], [address] [place], [place] [address]       Radiology Studies: CT Head Wo Contrast  Result Date:  12/04/2020 CLINICAL DATA:  Status post fall. EXAM: CT HEAD WITHOUT CONTRAST TECHNIQUE: Contiguous axial images were obtained from the base of the skull through the vertex without intravenous contrast. COMPARISON:  None. FINDINGS: Brain: There is mild to moderate severity cerebral atrophy with widening of the extra-axial spaces and ventricular dilatation. There are areas of decreased attenuation within the white matter tracts of the supratentorial brain, consistent with microvascular disease changes. A small chronic left basal ganglia lacunar infarct is seen. Vascular: No hyperdense vessel or unexpected calcification. Skull: Normal. Negative for fracture or focal lesion. Sinuses/Orbits: There is mild right maxillary sinus mucosal thickening.  Other: None. IMPRESSION: 1. Generalized cerebral atrophy. 2. Small chronic left basal ganglia lacunar infarct. 3. No acute intracranial abnormality. Electronically Signed   By: Aram Candelahaddeus  Houston M.D.   On: 12/04/2020 20:14   CT Cervical Spine Wo Contrast  Result Date: 12/04/2020 CLINICAL DATA:  Status post trauma. EXAM: CT CERVICAL SPINE WITHOUT CONTRAST TECHNIQUE: Multidetector CT imaging of the cervical spine was performed without intravenous contrast. Multiplanar CT image reconstructions were also generated. COMPARISON:  None. FINDINGS: Alignment: Normal. Skull base and vertebrae: No acute fracture. Chronic changes are seen involving the body and tip of the dens. No primary bone lesion or focal pathologic process. Soft tissues and spinal canal: No prevertebral fluid or swelling. No visible canal hematoma. Disc levels: Mild anterior osteophyte formation is seen at the level of C6-C7. There is moderate to marked severity endplate sclerosis at the level of C5-C6. There is marked severity narrowing of the anterior atlantoaxial articulation. Moderate to marked severity intervertebral disc space narrowing is seen at the level of C5-C6. Bilateral, mild-to-moderate severity  multilevel facet joint hypertrophy is noted. Upper chest: Mild atelectasis is seen within the left apex. Other: None. IMPRESSION: 1. Moderate to marked severity degenerative changes at the level of C5-C6. 2. No evidence of an acute fracture or subluxation. Electronically Signed   By: Aram Candelahaddeus  Houston M.D.   On: 12/04/2020 20:18   MR BRAIN WO CONTRAST  Result Date: 12/05/2020 CLINICAL DATA:  Initial evaluation for neuro deficit, stroke suspected. EXAM: MRI HEAD WITHOUT CONTRAST TECHNIQUE: Multiplanar, multiecho pulse sequences of the brain and surrounding structures were obtained without intravenous contrast. COMPARISON:  Prior CT from 12/04/2020. FINDINGS: Brain: Diffuse prominence of the CSF containing spaces compatible with generalized cerebral atrophy, moderately advanced in nature, and most pronounced at the frontotemporal region. Patchy and confluent T2/FLAIR hyperintensity about the periventricular and deep white matter both cerebral hemispheres most consistent with chronic small vessel ischemic disease, moderate in nature. Remote lacunar infarct with chronic hemosiderin staining present at the left basal ganglia. Additional chronic lacunar infarct at the left pons. Small remote left cerebellar infarct. Area of encephalomalacia and gliosis involving the anterior left frontal lobe consistent with a chronic left MCA distribution infarct. No abnormal foci of restricted diffusion to suggest acute or subacute ischemia. Gray-white matter differentiation otherwise maintained. No acute intracranial hemorrhage. No other foci of susceptibility artifact to suggest chronic intracranial hemorrhage. No mass lesion, midline shift or mass effect. Diffuse ventricular prominence related to global parenchymal volume loss without hydrocephalus. No extra-axial fluid collection. Pituitary gland suprasellar region normal. Midline structures intact. Vascular: Major intracranial vascular flow voids are maintained. Skull and upper  cervical spine: Craniocervical junction within normal limits. Bone marrow signal intensity normal. No scalp soft tissue abnormality. Sinuses/Orbits: Globes and orbital soft tissues within normal limits. Moderate mucosal thickening with pneumatized secretions noted within the right maxillary sinus. Paranasal sinuses are otherwise largely clear. No mastoid effusion. Inner ear structures grossly normal. Other: None. IMPRESSION: 1. No acute intracranial abnormality. 2. Chronic left MCA distribution infarct, with additional remote lacunar infarcts involving the left basal ganglia, left pons, and left cerebellar hemisphere. 3. Underlying moderately advanced cerebral atrophy with chronic small vessel ischemic disease. Electronically Signed   By: Rise MuBenjamin  McClintock M.D.   On: 12/05/2020 03:37   [facility] and [facility])  Result Date: 12/05/2020 CLINICAL DATA:  Hypertension, syncope, hyperlipidemia EXAM: BILATERAL CAROTID DUPLEX ULTRASOUND TECHNIQUE: Wallace CullensGray scale imaging, color Doppler and duplex ultrasound were performed of bilateral carotid and vertebral  arteries in the neck. COMPARISON:  None. FINDINGS: Criteria: Quantification of carotid stenosis is based on velocity parameters that correlate the residual internal carotid diameter with NASCET-based stenosis levels, using the diameter of the distal internal carotid lumen as the denominator for stenosis measurement. The following velocity measurements were obtained: RIGHT ICA: 54/23 cm/sec CCA: 94/25 cm/sec SYSTOLIC ICA/CCA RATIO:  0.6 ECA: 56 cm/sec LEFT ICA: 54/19 cm/sec CCA: 69/16 cm/sec SYSTOLIC ICA/CCA RATIO:  0.8 ECA: 78 cm/sec RIGHT CAROTID ARTERY: Intimal thickening and mild atherosclerotic changes with areas of calcification. No hemodynamically significant right ICA stenosis, velocity elevation, turbulent flow. Degree of narrowing less than 50% by ultrasound criteria. RIGHT VERTEBRAL ARTERY:  Normal antegrade flow LEFT CAROTID ARTERY: Similar  moderate intimal thickening and mixed echogenicity atherosclerosis. Scattered areas of calcification. No hemodynamically significant left ICA stenosis, velocity elevation, turbulent flow. Degree of narrowing also less than 50%. LEFT VERTEBRAL ARTERY:  Normal antegrade flow IMPRESSION: Moderate bilateral carotid atherosclerosis. No hemodynamically significant ICA stenosis. Degree of narrowing less than 50% bilaterally by ultrasound criteria. Patent antegrade vertebral flow bilaterally Electronically Signed   By: Judie Petit.  Shick M.D.   On: 12/05/2020 07:40   DG Chest Port 1 View  Result Date: 12/04/2020 CLINICAL DATA:  Status post fall. EXAM: PORTABLE CHEST 1 VIEW COMPARISON:  None. FINDINGS: Diffuse, chronic appearing increased interstitial lung markings are seen, bilaterally. Mild left basilar atelectasis and/or infiltrate is also seen. There is no evidence of a pleural effusion or pneumothorax. The heart size and mediastinal contours are within normal limits. The visualized skeletal structures are unremarkable. IMPRESSION: 1. Chronic interstitial lung disease with mild left basilar atelectasis and/or infiltrate. Electronically Signed   By: Aram Candela M.D.   On: 12/04/2020 20:27      Scheduled Meds: . aspirin EC  81 mg Oral Daily  . enoxaparin (LOVENOX) injection  40 mg Subcutaneous Q24H  . sodium chloride flush  3 mL Intravenous Q12H   Continuous Infusions: . azithromycin Stopped (12/04/20 2305)  . cefTRIAXone (ROCEPHIN)  IV Stopped (12/04/20 2151)  . lactated ringers 150 mL/hr at 12/05/20 0505     LOS: 0 days      Time spent: 35 minutes   Noralee Stain, DO [facility] 12/05/2020, 12:24 PM   Available via Epic secure chat 7am-7pm After these hours, please refer to coverage provider listed on [email or web address]

## 2020-12-05 NOTE — Progress Notes (Incomplete)
*  PRELIMINARY RESULTS* Echocardiogram 2D Echocardiogram has been performed.  Dustin Peterson 12/05/2020, 9:18 AM

## 2020-12-05 NOTE — Evaluation (Addendum)
Occupational Therapy Evaluation Patient Details Name: Dustin Peterson MRN: [id number] DOB: 10-15-1940 Today's Date: 12/05/2020    History of Present Illness Dustin Peterson is a 80 y.o. male with medical history significant of stroke, glaucoma, left eye blindness, hypertension, hyperlipidemia who presents after acting abnormally and being found down recently. He has been altered somewhat and has had decreased p.o. intake.   Clinical Impression   [name] was seen for OT evaluation this date. Prior to hospital admission, pt was MOD I for mobility and ADLs, assist from children for IADLs. Pt reports 1 fall, uses SPC at baseline. Pt lives alone in home c 5 STE and B rails, per conversation with PT, daughter reports plan for pt to d/c to her home. Pt presents to acute OT demonstrating impaired ADL performance and functional mobility 2/2 decreased activity tolerance and functional strength/balance deficits. Pt currently requires MOD I don B socks seated EOB. Initial sit<>stand c R HHA, pt requires BLE braced against bed for support, toelrates <27min standing prior to sitting with poor eccentric control. Improved with RW, required MIN A to stabilize RW and control descent. CGA + RW for ~10 ft mobility, intermittent B knee buckling noted, pt able to self-correct c BUE support on RW. Pt would benefit from skilled OT to address noted impairments and functional limitations (see below for any additional details) in order to maximize safety and independence while minimizing falls risk and caregiver burden. Upon hospital discharge, recommend HHOT to maximize pt safety and return to functional independence during meaningful occupations of daily life.     Follow Up Recommendations  Home health OT;Supervision/Assistance - 24 hour    Equipment Recommendations  3 in 1 bedside commode    Recommendations for Other Services       Precautions / Restrictions Precautions Precautions: Fall      Mobility Bed  Mobility Overal bed mobility: Needs Assistance Bed Mobility: Supine to Sit;Sit to Supine     Supine to sit: Supervision Sit to supine: Supervision        Transfers Overall transfer level: Needs assistance Equipment used: Rolling walker (2 wheeled) Transfers: Sit to/from Stand;Lateral/Scoot Transfers Sit to Stand: Min assist        Lateral/Scoot Transfers: Supervision General transfer comment: Initial sit<>stand c R HHA, pt requires BLE braced against bed for support, toelrates <56min standing prior to sitting with poor eccentric control. Improved with RW, required MIN A to stabilize RW and control descent    Balance Overall balance assessment: Needs assistance Sitting-balance support: No upper extremity supported;Feet supported Sitting balance-Leahy Scale: Good     Standing balance support: Bilateral upper extremity supported;During functional activity Standing balance-Leahy Scale: Fair Standing balance comment: requires BUE support                           ADL either performed or assessed with clinical judgement   ADL Overall ADL's : Needs assistance/impaired                                       General ADL Comments: MOD I don B socks seated EOB. CGA + RW for ADL t/f. Requires BUE support for static standing 2/2 intermittent B knee buckling                  Pertinent Vitals/Pain Pain Assessment: No/denies pain     Hand Dominance  Extremity/Trunk Assessment Upper Extremity Assessment Upper Extremity Assessment: Generalized weakness (R 4+/5 grossly, L 4/5 grossly)   Lower Extremity Assessment Lower Extremity Assessment: Generalized weakness (3+/5 grossly)       Communication Communication Communication: No difficulties   Cognition Arousal/Alertness: Awake/alert Behavior During Therapy: WFL for tasks assessed/performed Overall Cognitive Status: Within Functional Limits for tasks assessed                                      General Comments       Exercises Exercises: Other exercises Other Exercises Other Exercises: Pt educated re: OT role, DME recs, d/c recs, falls prevention, ECS Other Exercises: LBD, sup<>sit, lateral scoot, sit<>stand, sitting/standing balance/tolerance   Shoulder Instructions      Home Living Family/patient expects to be discharged to:: Private residence Living Arrangements: Alone Available Help at Discharge: Family Type of Home: Mobile home Home Access: Stairs to enter Entrance Stairs-Number of Steps: 5 Entrance Stairs-Rails: Can reach both Home Layout: One level     Bathroom Shower/Tub: Producer, television/film/video: Standard     Home Equipment: Cane - single point;Shower seat          Prior Functioning/Environment Level of Independence: Independent with assistive device(s)        Comments: Pt reports 1 fall on Friday, denies prior falls. Pt is limited household ambulator using SPC, assist from children for IADLs        OT Problem List: Decreased strength;Decreased activity tolerance;Impaired balance (sitting and/or standing);Decreased knowledge of use of DME or AE      OT Treatment/Interventions: Self-care/ADL training;Therapeutic exercise;Energy conservation;DME and/or AE instruction;Therapeutic activities;Patient/family education;Balance training    OT Goals(Current goals can be found in the care plan section) Acute Rehab OT Goals Patient Stated Goal: to return home OT Goal Formulation: With patient Time For Goal Achievement: 12/19/20 Potential to Achieve Goals: Good ADL Goals Pt Will Perform Grooming: with modified independence;standing (c LRAD PRN) Pt Will Transfer to Toilet: with modified independence;ambulating;regular height toilet (c LRAD PRN) Additional ADL Goal #1: Pt will Independently verbalize plan to implement x3 falls prevention strategies.  OT Frequency: Min 2X/week   Barriers to D/C: Decreased caregiver support              AM-PAC OT "6 Clicks" Daily Activity     Outcome Measure Help from another person eating meals?: None Help from another person taking care of personal grooming?: A Little Help from another person toileting, which includes using toliet, bedpan, or urinal?: A Little Help from another person bathing (including washing, rinsing, drying)?: A Little Help from another person to put on and taking off regular upper body clothing?: None Help from another person to put on and taking off regular lower body clothing?: A Little 6 Click Score: 20   End of Session Equipment Utilized During Treatment: Rolling walker  Activity Tolerance: Patient tolerated treatment well Patient left: in bed;with call bell/phone within reach;with bed alarm set (MD in room)  OT Visit Diagnosis: Other abnormalities of gait and mobility (R26.89);Muscle weakness (generalized) (M62.81)                Time: 2035-5974 OT Time Calculation (min): 17 min Charges:  OT General Charges $OT Visit: 1 Visit OT Evaluation $OT Eval Low Complexity: 1 Low OT Treatments $Self Care/Home Management : 8-22 mins  Kathie Dike, M.S. OTR/L  12/05/20, 10:21 AM  ascom [phone]

## 2020-12-05 NOTE — Progress Notes (Signed)
Speech Language Pathology Evaluation Patient Details Name: Dustin Peterson MRN: [id number] DOB: 16-Dec-1940 Today's Date: 12/05/2020 Time: 1425-1450 SLP Time Calculation (min) (ACUTE ONLY): 25 min  Problem List:  Patient Active Problem List   Diagnosis Date Noted   Weakness 12/05/2020   Sepsis (HCC) 12/04/2020   CAP (community acquired pneumonia) 12/04/2020   Acute lower UTI 12/04/2020   Pure hypercholesterolemia 03/06/2017   History of CVA (cerebrovascular accident) 08/10/2015   Past Medical History:  Past Medical History:  Diagnosis Date   Hypertension    Past Surgical History: History reviewed. No pertinent surgical history. HPI:  Dustin Peterson is a 80 y.o. male with medical history significant of stroke, glaucoma, left eye blindness, hypertension, hyperlipidemia who presents after acting abnormally and being found down recently. He has been altered somewhat and has had decreased p.o. intake. MRI negative for acute CVA, shows chronic left MCA infarct. Found to have sepsis 2/2 UTI vs PNA. Passed [employer] Screen.   Assessment / Plan / Recommendation Clinical Impression   Patient presents with baseline deficits in cognitive-communication function related to 2013 CVA. Daughter reported initial concern with pt's confusion and difficulty to communicate has been resolving with medical treatment; no acute CVA on MRI. Deficits observed during evaluation include impaired selective attention, short-term recall, and problem solving, which are consistent with SLP assessment from time of CVA. Slow processing resulted in difficulty following multistep or complex commands, however pt can complete functional tasks when information/commands broken into smaller steps. He scored 16/30 on the Mini Mental State Examination (26 and above is WNL). According to pt and family, deficits are close to baseline. Pt will stay with daughter upon d/c with HH. Feel pt unlikely to require [facility] SLP upon d/c as symptoms  resolve with medical treatment of infection, however educated pt and family if impairments noted with safety awareness or difficulty completing premorbid tasks, they can request SLP evaluation from [facility]. At this time no further skilled ST is indicated. SLP to s/o.    SLP Assessment  SLP Recommendation/Assessment: Patient does not need any further Speech Lanaguage Pathology Services SLP Visit Diagnosis: Cognitive communication deficit (R41.841)    Follow Up Recommendations  None    Frequency and Duration  (evaluation only)         SLP Evaluation Cognition  Overall Cognitive Status: History of cognitive impairments - at baseline Arousal/Alertness: Awake/alert Orientation Level: Oriented to person;Oriented to place;Oriented to situation (oriented to day of the week and month, not year) Attention: Sustained;Selective Sustained Attention: Impaired Sustained Attention Impairment: Verbal complex;Functional complex Selective Attention: Impaired Selective Attention Impairment: Verbal basic;Functional basic (trouble focusing when TV on) Memory: Impaired Memory Impairment: Decreased short term memory;Decreased recall of new information Decreased Short Term Memory: Verbal basic Immediate Memory Recall:  (3/3 words immediate, 1/3 with delay) Awareness: Impaired Awareness Impairment: Emergent impairment;Anticipatory impairment Problem Solving: Impaired Problem Solving Impairment: Verbal complex (able to solve simple functional money problems) Safety/Judgment:  (none reported or noted by OT/PT during functional mobility)       Comprehension  Auditory Comprehension Overall Auditory Comprehension: Impaired Yes/No Questions: Within Functional Limits Commands: Impaired Multistep Basic Commands: 50-74% accurate Complex Commands: 50-74% accurate Conversation: Simple Other Conversation Comments: deficits appear due to attention/ processing vs comprehension Interfering Components:  Attention;Processing speed;Working Theatre manager: Not tested Reading Comprehension Reading Status:  (able to read simple sentence)    Expression Expression Primary Mode of Expression: Verbal Verbal Expression Overall Verbal Expression: Appears within functional limits for tasks assessed Initiation: No  impairment (sometimes slow to respond (processing)) Automatic Speech: Name;Social Response Level of Generative/Spontaneous Verbalization: Sentence Repetition: No impairment Naming: No impairment Pragmatics: Impairment Impairments: Abnormal affect (somewhat flat affect) Interfering Components: Premorbid deficit Non-Verbal Means of Communication: Not applicable Written Expression Dominant Hand: Right Written Expression:  (wrote 3 word sentence (self-formulated). doesn't do much reading/writing per family)   Oral / Motor  Oral Motor/Sensory Function Overall Oral Motor/Sensory Function: Within functional limits Motor Speech Overall Motor Speech: Appears within functional limits for tasks assessed   GO             Rondel Baton, MS, CCC-SLP Speech-Language Pathologist         Arlana Lindau 12/05/2020, 3:26 PM

## 2020-12-06 DIAGNOSIS — R531 Weakness: Secondary | ICD-10-CM

## 2020-12-06 LAB — BASIC METABOLIC PANEL
Anion gap: 9 (ref 5–15)
BUN: 17 mg/dL (ref 8–23)
CO2: 26 mmol/L (ref 22–32)
Calcium: 8.5 mg/dL — ABNORMAL LOW (ref 8.9–10.3)
Chloride: 100 mmol/L (ref 98–111)
Creatinine, Ser: 0.84 mg/dL (ref 0.61–1.24)
GFR, Estimated: >60 mL/min (ref >60)
Glucose, Bld: 68 mg/dL — ABNORMAL LOW (ref 70–99)
Potassium: 4 mmol/L (ref 3.5–5.1)
Sodium: 135 mmol/L (ref 135–145)

## 2020-12-06 LAB — URINE CULTURE: Culture: NO GROWTH

## 2020-12-06 LAB — CBC
HCT: 35.1 % — ABNORMAL LOW (ref 39.0–52.0)
Hemoglobin: 11.8 g/dL — ABNORMAL LOW (ref 13.0–17.0)
MCH: 30.8 pg (ref 26.0–34.0)
MCHC: 33.6 g/dL (ref 30.0–36.0)
MCV: 91.6 fL (ref 80.0–100.0)
Platelets: 163 10*3/uL (ref 150–400)
RBC: 3.83 MIL/uL — ABNORMAL LOW (ref 4.22–5.81)
RDW: 14.1 % (ref 11.5–15.5)
WBC: 6.4 10*3/uL (ref 4.0–10.5)
nRBC: 0 % (ref 0.0–0.2)

## 2020-12-06 MED ORDER — AMOXICILLIN-POT CLAVULANATE 875-125 MG PO TABS: 1.0000 | ORAL_TABLET | Freq: Two times a day (BID) | ORAL | 0 refills | Status: AC

## 2020-12-06 MED ORDER — SENNA 8.6 MG PO TABS: 1.0000 | ORAL_TABLET | Freq: Every day | ORAL | 0 refills | Status: AC

## 2020-12-06 MED ORDER — ASPIRIN 81 MG PO TBEC: 81.0000 mg | DELAYED_RELEASE_TABLET | Freq: Every day | ORAL | 11 refills | Status: AC

## 2020-12-06 MED ORDER — AMOXICILLIN-POT CLAVULANATE 875-125 MG PO TABS
1.0000 | ORAL_TABLET | Freq: Two times a day (BID) | ORAL | Status: DC
  Administered 2020-12-06: 13:00:00 1 via ORAL
  Filled 2020-12-06: qty 1

## 2020-12-06 NOTE — Progress Notes (Signed)
Writer went through discharge paper with family, verbalize understanding, patient left POV with family

## 2020-12-06 NOTE — Progress Notes (Signed)
Physical Therapy Treatment Patient Details Name: Dustin Peterson MRN: [id number] DOB: [birth year] Today's Date: 12/06/2020    History of Present Illness Zacory Fiola is a 80 y.o. male with medical history significant of stroke, glaucoma, left eye blindness, hypertension, hyperlipidemia who presents after acting abnormally and being found down recently. He has been altered somewhat and has had decreased p.o. intake.    PT Comments    Pt received in bed this am, denies pain, agrees to PT.  Pt able to transfer to EOB with ModI. Sit <>stand transfers with occasional MinA for safety and attaining upright posture.  Overall good balance with gait x35 ft with RW, slow cadence, and cues to stay inside walker.  Pt positioned up in chair with call bell and chair alarm on.  Pt HR sitting EOB 69bpm, after ambulation pt at 79bpm.  No complaints throughout session.  Pt awaiting d/c to daughter's home once medically cleared.   Follow Up Recommendations  Home health PT;Supervision for mobility/OOB     Equipment Recommendations  Rolling walker with 5" wheels    Recommendations for Other Services       Precautions / Restrictions Precautions Precautions: Fall Restrictions Weight Bearing Restrictions: No    Mobility  Bed Mobility Overal bed mobility: Modified Independent Bed Mobility: Supine to Sit;Sit to Supine     Supine to sit: Modified independent (Device/Increase time)     General bed mobility comments: Increased time to complete    Transfers Overall transfer level: Needs assistance Equipment used: Rolling walker (2 wheeled) Transfers: Sit to/from Stand Sit to Stand: Min guard;Min assist        Lateral/Scoot Transfers: Supervision General transfer comment: Slight MinA given to attain full uprigt standing, MinA for safe stand to sit  Ambulation/Gait Ambulation/Gait assistance: Min guard Gait Distance (Feet): 35 Feet Assistive device: Rolling walker (2 wheeled) Gait  Pattern/deviations: Step-through pattern;Decreased step length - right;Decreased step length - left;Shuffle Gait velocity: decreased   General Gait Details: Cues needed to stay inside of RW   Stairs             Wheelchair Mobility    Modified Rankin (Stroke Patients Only)       Balance Overall balance assessment: Needs assistance Sitting-balance support: Feet supported Sitting balance-Leahy Scale: Good Sitting balance - Comments: Initial Right lateral lean, able to self correct with cues   Standing balance support: Bilateral upper extremity supported;During functional activity Standing balance-Leahy Scale: Fair Standing balance comment: requires BUE support                            Cognition Arousal/Alertness: Awake/alert Behavior During Therapy: WFL for tasks assessed/performed Overall Cognitive Status: History of cognitive impairments - at baseline                                        Exercises      General Comments        Pertinent Vitals/Pain Pain Assessment: No/denies pain    Home Living                      Prior Function            PT Goals (current goals can now be found in the care plan section) Acute Rehab PT Goals Patient Stated Goal: planning to stay with daughter at discharge  Frequency    Min 2X/week      PT Plan      Co-evaluation              AM-PAC PT "6 Clicks" Mobility   Outcome Measure  Help needed turning from your back to your side while in a flat bed without using bedrails?: None Help needed moving from lying on your back to sitting on the side of a flat bed without using bedrails?: A Little Help needed moving to and from a bed to a chair (including a wheelchair)?: A Little Help needed standing up from a chair using your arms (e.g., wheelchair or bedside chair)?: A Little Help needed to walk in hospital room?: A Little Help needed climbing 3-5 steps with a railing? : A  Little 6 Click Score: 19    End of Session Equipment Utilized During Treatment: Gait belt Activity Tolerance: Patient tolerated treatment well;Patient limited by fatigue Patient left: in chair;with call bell/phone within reach;with chair alarm set Nurse Communication: Mobility status PT Visit Diagnosis: Muscle weakness (generalized) (M62.81);Difficulty in walking, not elsewhere classified (R26.2);History of falling (Z91.81)     Time: 7096-2836 PT Time Calculation (min) (ACUTE ONLY): 31 min  Charges:  $Therapeutic Activity: 23-37 mins                    Zadie Cleverly, PTA    Jannet Askew 12/06/2020, 9:58 AM

## 2020-12-06 NOTE — TOC Initial Note (Addendum)
Transition of Care [facility]) - Initial/Assessment Note    Patient Details  Name: Dustin Peterson MRN: [id number] Date of Birth: 12/19/1940  [facility] CM/SW Contact:    Margarito Liner, LCSW Phone Number: 12/06/2020, 1:29 PM  Clinical Narrative: This CSW working remote today. Called patient in the room, introduced role, and explained that PT recommendations would be discussed. Patient gave phone to his daughter. She is agreeable to home health PT and OT. No agency preference. Patient has not seen his PCP Dewaine Oats, MD in an extended period of time. Their office is closed until 2:00 for lunch so will see if they can get him an appt within the next 30 days. If not, will set up new PCP appointment at [facility]. [facility] is reviewing referral. Patient has a walker and cane at home. Daughter agreeable to 3-in-1. Asked MD for DME order. Patient will be going home with his daughter today at [address], [place], [place] [address].            2:12 pm: Patient already left hospital. Ordered 3-in-1 to be shipped to daughter's home. Dr. Maree Krabbe office said he was not in their system and they were not accepting new patients. New PCP appt made with Orson Eva, NP at [facility] on 4/25. Referral decision still pending for [facility].      3:08 pm: [facility] can accept patient but start of care will not be until 4/19. No other agency able to accept so far. Daughter is aware and agreeable. Asked MD to edit [facility] order to show "Start of care 4/19." Sent secure chat to [facility] Physician Advisor to see if they will cover HH orders until his PCP appointment. She was told that the doctor on call is the one that does it. Added Dr. Flossie Dibble to the chat.  Expected Discharge Plan: Home w Home Health Services Barriers to Discharge: Other (comment) (PCP, Setting up [facility])   Patient Goals and CMS Choice        Expected Discharge Plan and Services Expected Discharge  Plan: Home w Home Health Services     Post Acute Care Choice: Durable Medical Equipment,Home Health Living arrangements for the past 2 months: Single Family Home Expected Discharge Date: 12/06/20                                    Prior Living Arrangements/Services Living arrangements for the past 2 months: Single Family Home Lives with:: Self Patient language and need for interpreter reviewed:: Yes Do you feel safe going back to the place where you live?: Yes      Need for Family Participation in Patient Care: Yes (Comment) Care giver support system in place?: Yes (comment) Current home services: DME Criminal Activity/Legal Involvement Pertinent to Current Situation/Hospitalization: No - Comment as needed  Activities of Daily Living Home Assistive Devices/Equipment: None ADL Screening (condition at time of admission) Patient's cognitive ability adequate to safely complete daily activities?: Yes Is the patient deaf or have difficulty hearing?: No Does the patient have difficulty seeing, even when wearing glasses/contacts?: No Does the patient have difficulty concentrating, remembering, or making decisions?: No Patient able to express need for assistance with ADLs?: Yes Does the patient have difficulty dressing or bathing?: No Independently performs ADLs?: Yes (appropriate for developmental age) Does the patient have difficulty walking or climbing stairs?: Yes Weakness of Legs: None Weakness  of Arms/Hands: None  Permission Sought/Granted Permission sought to share information with : Facility Contact Representative,Family Supports Permission granted to share information with : Yes, Verbal Permission Granted  Share Information with NAME: Elissa Lovett  Permission granted to share info w AGENCY: Home Health Agencies  Permission granted to share info w Relationship: Daughter  Permission granted to share info w Contact Information: [phone]  Emotional  Assessment Appearance:: Appears stated age Attitude/Demeanor/Rapport: Engaged,Gracious Affect (typically observed): Accepting,Appropriate,Calm,Pleasant Orientation: : Oriented to Self,Oriented to Place,Oriented to  Time,Oriented to Situation Alcohol / Substance Use: Not Applicable Psych Involvement: No (comment)  Admission diagnosis:  Acute focal neurological deficit [R29.818] Sepsis due to pneumonia (HCC) [J18.9, A41.9] Weakness [R53.1] Patient Active Problem List   Diagnosis Date Noted  . Weakness 12/05/2020  . Sepsis (HCC) 12/04/2020  . CAP (community acquired pneumonia) 12/04/2020  . Acute lower UTI 12/04/2020  . Pure hypercholesterolemia 03/06/2017  . History of CVA (cerebrovascular accident) 08/10/2015   PCP:  Pcp, No Pharmacy:  No Pharmacies Listed    Social Determinants of Health (SDOH) Interventions    Readmission Risk Interventions No flowsheet data found.

## 2020-12-06 NOTE — Discharge Summary (Signed)
Physician Discharge Summary  Micha Dosanjh DVV:[id number] DOB: September 29, 1940 DOA: 12/04/2020  PCP: Pcp, No  Admit date: 12/04/2020 Discharge date: 12/06/2020  Time spent: 37 minutes  Recommendations for Outpatient Follow-up:  1. Patient should complete Augmentin for presumed pneumonia and get a chest x-ray in about 4 weeks to denote clearing of infiltrates 2. PCP should be arranged by TOC-patient has had a stroke and has history of hypertension and other medical issues but is not on any medications 3. 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  R weakness-several falls several days prior to admission-decreased p.o. intake and inability to ambulate-found to be tachycardic on arrival to ED-also found to have demand ischemia Patient admitted for acute superimposed on chronic right sided weakness- MRI 4/11 = no acute intracranial abnormality CXR = L PNA left basilar atelectasis  Hospital Course:  Acute superimposed on chronic right-sided weakness MRI CT negative for stroke has moderately advanced atrophy Seems to be back at baseline on discharge Aspirin to continue at home and reinforced this with family Therapy recommending home health which has been ordered and he has equipment at home Sepsis secondary to pneumonia Lactic acidosis and tachycardic on arrival Both resolved on  discharge-discharging on Augmentin to complete at home UTI was ruled out and urine culture was negative 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 Left ventricular ejection fraction, by estimation, is 60 to 65%. The  left ventricle has normal function. The left ventricle has no regional  wall motion abnormalities. Left ventricular diastolic parameters are  consistent with Grade I diastolic  dysfunction (impaired relaxation). The average left ventricular global  longitudinal strain is -17.5 %. The global longitudinal strain is normal.  2. Right ventricular systolic function is normal. The right ventricular  size is normal. Tricuspid regurgitation signal is inadequate for assessing  PA pressure.  3. The mitral valve is grossly normal. Trivial mitral valve  regurgitation. No evidence of mitral stenosis.  4. The aortic valve is tricuspid. Aortic valve regurgitation is not  visualized. No aortic stenosis is present.  Consultations:  None  Discharge Exam: Vitals:   12/06/20 0917 12/06/20 1128  BP: (!) 147/66 122/77  Pulse: (!) 55 71  Resp:  20  Temp: 97.8 F (36.6 C) 97.6 F (36.4 C)  SpO2: 100% 100%    Subj on day of d/c   Awake coherent no distress EOMI NCAT Able to verbalize and follow commands some  General Exam on discharge  Coherent pleasant no distress EOMI NCAT no focal deficit Chest clear no rales no rhonchi S1-S2 no murmur no rub no gallop Abdomen soft nontender no rebound no guarding Neurologically intact moving all 4 limbs equally Power 5/5 no deficits  Discharge Instructions   Discharge Instructions    Diet - low sodium heart healthy   Complete  by: As directed    Discharge instructions   Complete by: As directed    Please finish the abx We will order therapy services for you Ensure that u  take ur meds usually   Increase activity slowly   Complete by: As directed      Allergies as of 12/06/2020   No Known Allergies     Medication List    TAKE these medications   amoxicillin-clavulanate 875-125 MG tablet Commonly known as: AUGMENTIN Take 1 tablet by mouth every 12 (twelve) hours.   aspirin 81 MG EC tablet Take 1 tablet (81 mg total) by mouth daily. Swallow whole. Start taking on: December 07, 2020   senna 8.6 MG Tabs tablet Commonly known as: SENOKOT Take 1 tablet (8.6 mg total) by mouth daily.      No Known Allergies    The results of significant diagnostics from this hospitalization (including imaging, microbiology, ancillary and laboratory) are listed below for reference.    Significant Diagnostic Studies: CT Head Wo Contrast  Result Date: 12/04/2020 CLINICAL DATA:  Status post fall. EXAM: CT HEAD WITHOUT CONTRAST TECHNIQUE: Contiguous axial images were obtained from the base of the skull through the vertex without intravenous contrast. COMPARISON:  None. FINDINGS: Brain: There is mild to moderate severity cerebral atrophy with widening of the extra-axial spaces and ventricular dilatation. There are areas of decreased attenuation within the white matter tracts of the supratentorial brain, consistent with microvascular disease changes. A small chronic left basal ganglia lacunar infarct is seen. Vascular: No hyperdense vessel or unexpected calcification. Skull: Normal. Negative for fracture or focal lesion. Sinuses/Orbits: There is mild right maxillary sinus mucosal thickening. Other: None. IMPRESSION: 1. Generalized cerebral atrophy. 2. Small chronic left basal ganglia lacunar infarct. 3. No acute intracranial abnormality. Electronically Signed   By: Aram Candela M.D.   On: 12/04/2020 20:14   CT Cervical Spine Wo Contrast  Result Date: 12/04/2020 CLINICAL DATA:  Status post trauma. EXAM: CT CERVICAL SPINE WITHOUT CONTRAST TECHNIQUE: Multidetector CT imaging of  the cervical spine was performed without intravenous contrast. Multiplanar CT image reconstructions were also generated. COMPARISON:  None. FINDINGS: Alignment: Normal. Skull base and vertebrae: No acute fracture. Chronic changes are seen involving the body and tip of the dens. No primary bone lesion or focal pathologic process. Soft tissues and spinal canal: No prevertebral fluid or swelling. No visible canal hematoma. Disc levels: Mild anterior osteophyte formation is seen at the level of C6-C7. There is moderate to marked severity endplate sclerosis at the level of C5-C6. There is marked severity narrowing of the anterior atlantoaxial articulation. Moderate to marked severity intervertebral disc space narrowing is seen at the level of C5-C6. Bilateral, mild-to-moderate severity multilevel facet joint hypertrophy is noted. Upper chest: Mild atelectasis is seen within the left apex. Other: None. IMPRESSION: 1. Moderate to marked severity degenerative changes at the level of C5-C6. 2. No evidence of an acute fracture or subluxation. Electronically Signed   By: Aram Candela M.D.   On: 12/04/2020 20:18   MR BRAIN WO CONTRAST  Result Date: 12/05/2020 CLINICAL DATA:  Initial evaluation for neuro deficit, stroke suspected. EXAM: MRI HEAD WITHOUT CONTRAST TECHNIQUE: Multiplanar, multiecho pulse sequences of the brain and surrounding structures were obtained without intravenous contrast. COMPARISON:  Prior CT from 12/04/2020. FINDINGS: Brain: Diffuse prominence of the CSF containing spaces compatible with generalized cerebral atrophy, moderately advanced in nature, and most pronounced at the frontotemporal region. Patchy and confluent T2/FLAIR hyperintensity about the periventricular and deep  white matter both cerebral hemispheres most consistent with chronic small vessel ischemic disease, moderate in nature. Remote lacunar infarct with chronic hemosiderin staining present at the left basal ganglia. Additional  chronic lacunar infarct at the left pons. Small remote left cerebellar infarct. Area of encephalomalacia and gliosis involving the anterior left frontal lobe consistent with a chronic left MCA distribution infarct. No abnormal foci of restricted diffusion to suggest acute or subacute ischemia. Gray-white matter differentiation otherwise maintained. No acute intracranial hemorrhage. No other foci of susceptibility artifact to suggest chronic intracranial hemorrhage. No mass lesion, midline shift or mass effect. Diffuse ventricular prominence related to global parenchymal volume loss without hydrocephalus. No extra-axial fluid collection. Pituitary gland suprasellar region normal. Midline structures intact. Vascular: Major intracranial vascular flow voids are maintained. Skull and upper cervical spine: Craniocervical junction within normal limits. Bone marrow signal intensity normal. No scalp soft tissue abnormality. Sinuses/Orbits: Globes and orbital soft tissues within normal limits. Moderate mucosal thickening with pneumatized secretions noted within the right maxillary sinus. Paranasal sinuses are otherwise largely clear. No mastoid effusion. Inner ear structures grossly normal. Other: None. IMPRESSION: 1. No acute intracranial abnormality. 2. Chronic left MCA distribution infarct, with additional remote lacunar infarcts involving the left basal ganglia, left pons, and left cerebellar hemisphere. 3. Underlying moderately advanced cerebral atrophy with chronic small vessel ischemic disease. Electronically Signed   By: Rise Mu M.D.   On: 12/05/2020 03:37   US Carotid Bilateral (at [facility] and [facility] only)  Result Date: 12/05/2020 CLINICAL DATA:  Hypertension, syncope, hyperlipidemia EXAM: BILATERAL CAROTID DUPLEX ULTRASOUND TECHNIQUE: Wallace Cullens scale imaging, color Doppler and duplex ultrasound were performed of bilateral carotid and vertebral arteries in the neck. COMPARISON:  None. FINDINGS: Criteria:  Quantification of carotid stenosis is based on velocity parameters that correlate the residual internal carotid diameter with NASCET-based stenosis levels, using the diameter of the distal internal carotid lumen as the denominator for stenosis measurement. The following velocity measurements were obtained: RIGHT ICA: 54/23 cm/sec CCA: 94/25 cm/sec SYSTOLIC ICA/CCA RATIO:  0.6 ECA: 56 cm/sec LEFT ICA: 54/19 cm/sec CCA: 69/16 cm/sec SYSTOLIC ICA/CCA RATIO:  0.8 ECA: 78 cm/sec RIGHT CAROTID ARTERY: Intimal thickening and mild atherosclerotic changes with areas of calcification. No hemodynamically significant right ICA stenosis, velocity elevation, turbulent flow. Degree of narrowing less than 50% by ultrasound criteria. RIGHT VERTEBRAL ARTERY:  Normal antegrade flow LEFT CAROTID ARTERY: Similar moderate intimal thickening and mixed echogenicity atherosclerosis. Scattered areas of calcification. No hemodynamically significant left ICA stenosis, velocity elevation, turbulent flow. Degree of narrowing also less than 50%. LEFT VERTEBRAL ARTERY:  Normal antegrade flow IMPRESSION: Moderate bilateral carotid atherosclerosis. No hemodynamically significant ICA stenosis. Degree of narrowing less than 50% bilaterally by ultrasound criteria. Patent antegrade vertebral flow bilaterally Electronically Signed   By: Judie Petit.  Shick M.D.   On: 12/05/2020 07:40   DG Chest Port 1 View  Result Date: 12/04/2020 CLINICAL DATA:  Status post fall. EXAM: PORTABLE CHEST 1 VIEW COMPARISON:  None. FINDINGS: Diffuse, chronic appearing increased interstitial lung markings are seen, bilaterally. Mild left basilar atelectasis and/or infiltrate is also seen. There is no evidence of a pleural effusion or pneumothorax. The heart size and mediastinal contours are within normal limits. The visualized skeletal structures are unremarkable. IMPRESSION: 1. Chronic interstitial lung disease with mild left basilar atelectasis and/or infiltrate. Electronically  Signed   By: Aram Candela M.D.   On: 12/04/2020 20:27   ECHOCARDIOGRAM COMPLETE  Result Date: 12/05/2020    ECHOCARDIOGRAM REPORT   Patient Name:  Dustin Peterson Date of Exam: 12/05/2020 Medical Rec #:  [id number]      Height:       69.0 in Accession #:    [id number]     Weight:       129.6 lb Date of Birth:  10/21/1940     BSA:          1.718 m Patient Age:    79 years       BP:           120/71 mmHg Patient Gender: M              HR:           58 bpm. Exam Location:  [facility] Procedure: 2D Echo, Color Doppler, Cardiac Doppler and Strain Analysis Indications:     I63.9 Stroke  History:         Patient has no prior history of Echocardiogram examinations.                  Risk Factors:Hypertension.  Sonographer:     Humphrey Rolls RDCS (AE) Referring Phys:  [id number] Cecille Po MELVIN Diagnosing Phys: Yvonne Kendall MD  Sonographer Comments: Suboptimal parasternal window and suboptimal subcostal window. Global longitudinal strain was attempted. IMPRESSIONS  1. Left ventricular ejection fraction, by estimation, is 60 to 65%. The left ventricle has normal function. The left ventricle has no regional wall motion abnormalities. Left ventricular diastolic parameters are consistent with Grade I diastolic dysfunction (impaired relaxation). The average left ventricular global longitudinal strain is -17.5 %. The global longitudinal strain is normal.  2. Right ventricular systolic function is normal. The right ventricular size is normal. Tricuspid regurgitation signal is inadequate for assessing PA pressure.  3. The mitral valve is grossly normal. Trivial mitral valve regurgitation. No evidence of mitral stenosis.  4. The aortic valve is tricuspid. Aortic valve regurgitation is not visualized. No aortic stenosis is present. FINDINGS  Left Ventricle: Left ventricular ejection fraction, by estimation, is 60 to 65%. The left ventricle has normal function. The left ventricle has no regional wall motion abnormalities. The average  left ventricular global longitudinal strain is -17.5 %. The global longitudinal strain is normal. The left ventricular internal cavity size was normal in size. There is borderline left ventricular hypertrophy. Left ventricular diastolic parameters are consistent with Grade I diastolic dysfunction (impaired relaxation). Right Ventricle: The right ventricular size is normal. No increase in right ventricular wall thickness. Right ventricular systolic function is normal. Tricuspid regurgitation signal is inadequate for assessing PA pressure. Left Atrium: Left atrial size was normal in size. Right Atrium: Right atrial size was normal in size. Pericardium: There is no evidence of pericardial effusion. Mitral Valve: The mitral valve is grossly normal. Trivial mitral valve regurgitation. No evidence of mitral valve stenosis. MV peak gradient, 3.9 mmHg. The mean mitral valve gradient is 1.0 mmHg. Tricuspid Valve: The tricuspid valve is grossly normal. Tricuspid valve regurgitation is trivial. Aortic Valve: The aortic valve is tricuspid. Aortic valve regurgitation is not visualized. No aortic stenosis is present. Aortic valve mean gradient measures 4.0 mmHg. Aortic valve peak gradient measures 6.4 mmHg. Aortic valve area, by VTI measures 3.44 cm. Pulmonic Valve: The pulmonic valve was normal in structure. Pulmonic valve regurgitation is not visualized. No evidence of pulmonic stenosis. Aorta: The aortic root is normal in size and structure. Pulmonary Artery: The pulmonary artery is of normal size. Venous: The inferior vena cava was not well visualized. IAS/Shunts: The interatrial septum was not well  visualized.  LEFT VENTRICLE PLAX 2D LVIDd:         3.00 cm  Diastology LVIDs:         1.80 cm  LV e' medial:    6.09 cm/s LV PW:         1.00 cm  LV E/e' medial:  13.1 LV IVS:        0.80 cm  LV e' lateral:   5.77 cm/s LVOT diam:     2.40 cm  LV E/e' lateral: 13.8 LV SV:         95 LV SV Index:   55       2D Longitudinal Strain  LVOT Area:     4.52 cm 2D Strain GLS Avg:     -17.5 %  RIGHT VENTRICLE RV Basal diam:  2.80 cm LEFT ATRIUM           Index       RIGHT ATRIUM           Index LA diam:      3.00 cm 1.75 cm/m  RA Area:     10.90 cm LA Vol (A4C): 20.4 ml 11.87 ml/m RA Volume:   22.20 ml  12.92 ml/m  AORTIC VALVE                   PULMONIC VALVE AV Area (Vmax):    3.12 cm    PV Vmax:       0.98 m/s AV Area (Vmean):   2.97 cm    PV Vmean:      64.100 cm/s AV Area (VTI):     3.44 cm    PV VTI:        0.189 m AV Vmax:           126.00 cm/s PV Peak grad:  3.9 mmHg AV Vmean:          88.900 cm/s PV Mean grad:  2.0 mmHg AV VTI:            0.276 m AV Peak Grad:      6.4 mmHg AV Mean Grad:      4.0 mmHg LVOT Vmax:         86.80 cm/s LVOT Vmean:        58.400 cm/s LVOT VTI:          0.210 m LVOT/AV VTI ratio: 0.76  AORTA Ao Root diam: 3.40 cm MITRAL VALVE MV Area (PHT): 2.50 cm    SHUNTS MV Area VTI:   3.04 cm    Systemic VTI:  0.21 m MV Peak grad:  3.9 mmHg    Systemic Diam: 2.40 cm MV Mean grad:  1.0 mmHg MV Vmax:       0.99 m/s MV Vmean:      49.3 cm/s MV Decel Time: 304 msec MV E velocity: 79.70 cm/s MV A velocity: 92.50 cm/s MV E/A ratio:  0.86 Cristal Deerhristopher End MD Electronically signed by Yvonne Kendallhristopher End MD Signature Date/Time: 12/05/2020/4:08:10 PM    Final     Microbiology: Recent Results (from the past 240 hour(s))  Culture, Urine     Status: None   Collection Time: 12/04/20  7:35 PM   Specimen: Urine, Random  Result Value Ref Range Status   Specimen Description   Final    URINE, RANDOM Performed at [facility], [address] [place], [place] [address]    Special Requests   Final    NONE Performed at [facility],  [address], [place], [place] [address]    Culture   Final    NO GROWTH Performed at [facility], [address], [place], [place] [address]    Report Status 12/06/2020 FINAL  Final  Culture, blood (single)     Status: None (Preliminary result)   Collection Time:  12/04/20  9:04 PM   Specimen: BLOOD  Result Value Ref Range Status   Specimen Description BLOOD RIGHT ANTECUBITAL  Final   Special Requests   Final    BOTTLES DRAWN AEROBIC AND ANAEROBIC Blood Culture adequate volume   Culture   Final    NO GROWTH 2 DAYS Performed at [facility], [address], [place], [place] [address]    Report Status PENDING  Incomplete  Resp Panel by RT-PCR (Flu A&B, Covid) Nasopharyngeal Swab     Status: None   Collection Time: 12/04/20  9:04 PM   Specimen: Nasopharyngeal Swab; Nasopharyngeal(NP) swabs in vial transport medium  Result Value Ref Range Status   SARS Coronavirus 2 by RT PCR NEGATIVE NEGATIVE Final    Comment: (NOTE) SARS-CoV-2 target nucleic acids are NOT DETECTED.  The SARS-CoV-2 RNA is generally detectable in upper respiratory specimens during the acute phase of infection. The lowest concentration of SARS-CoV-2 viral copies this assay can detect is 138 copies/mL. A negative result does not preclude SARS-Cov-2 infection and should not be used as the sole basis for treatment or other patient management decisions. A negative result may occur with  improper specimen collection/handling, submission of specimen other than nasopharyngeal swab, presence of viral mutation(s) within the areas targeted by this assay, and inadequate number of viral copies(<138 copies/mL). A negative result must be combined with clinical observations, patient history, and epidemiological information. The expected result is Negative.  Fact Sheet for Patients:  BloggerCourse.com  Fact Sheet for Healthcare Providers:  SeriousBroker.it  This test is no t yet approved or cleared by the [place] FDA and  has been authorized for detection and/or diagnosis of SARS-CoV-2 by FDA under an Emergency Use Authorization (EUA). This EUA will remain  in effect (meaning this test can be used) for the duration of  the COVID-19 declaration under Section 564(b)(1) of the Act, 21 U.S.C.section 360bbb-3(b)(1), unless the authorization is terminated  or revoked sooner.       Influenza A by PCR NEGATIVE NEGATIVE Final   Influenza B by PCR NEGATIVE NEGATIVE Final    Comment: (NOTE) The Xpert Xpress SARS-CoV-2/FLU/RSV plus assay is intended as an aid in the diagnosis of influenza from Nasopharyngeal swab specimens and should not be used as a sole basis for treatment. Nasal washings and aspirates are unacceptable for Xpert Xpress SARS-CoV-2/FLU/RSV testing.  Fact Sheet for Patients: BloggerCourse.com  Fact Sheet for Healthcare Providers: SeriousBroker.it  This test is not yet approved or cleared by the [place] FDA and has been authorized for detection and/or diagnosis of SARS-CoV-2 by FDA under an Emergency Use Authorization (EUA). This EUA will remain in effect (meaning this test can be used) for the duration of the COVID-19 declaration under Section 564(b)(1) of the Act, 21 U.S.C. section 360bbb-3(b)(1), unless the authorization is terminated or revoked.  Performed at [facility], [address] [place], [place] [address]      Labs: Basic Metabolic Panel: Recent Labs  Lab 12/04/20 1935 12/05/20 0536 12/06/20 0456  NA 135 137 135  K 4.2 3.6 4.0  CL 99 101 100  CO2 GLUCOSE 95 79  68*  BUN 27* 24* 17  CREATININE 1.16 0.93 0.84  CALCIUM 9.7 8.6* 8.5*   Liver Function Tests: Recent Labs  Lab 12/04/20 1935 12/05/20 0536  AST 34 21  ALT 15 10  ALKPHOS 58 45  BILITOT 1.4* 0.9  PROT 8.0 5.9*  ALBUMIN 3.8 2.9*   No results for input(s): LIPASE, AMYLASE in the last 168 hours. No results for input(s): AMMONIA in the last 168 hours. CBC: Recent Labs  Lab 12/04/20 1935 12/05/20 0536 12/06/20 0456  WBC 10.8* 7.4 6.4  NEUTROABS 7.5  --   --   HGB 15.3 12.2* 11.8*  HCT 45.9 36.8* 35.1*  MCV 90.4 92.0  91.6  PLT 212 166 163   Cardiac Enzymes: No results for input(s): CKTOTAL, CKMB, CKMBINDEX, TROPONINI in the last 168 hours. BNP: BNP (last 3 results) No results for input(s): BNP in the last 8760 hours.  ProBNP (last 3 results) No results for input(s): PROBNP in the last 8760 hours.  CBG: No results for input(s): GLUCAP in the last 168 hours.     Signed:  Rhetta Mura MD   [facility] 12/06/2020, 12:46 PM

## 2020-12-06 NOTE — Hospital Course (Signed)
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  R weakness-several falls several days prior to admission-decreased p.o. intake and inability to ambulate-found to be tachycardic on arrival to ED-also found to have demand ischemia Patient admitted for acute superimposed on chronic right sided weakness- MRI 4/11 = no acute intracranial abnormality CXR = L PNA left basilar atelectasis   Sepsis secondary to left-sided pneumonia with lactic acidosis 2.6 on admission Acute on chronic right-sided weakness with history of CVA 02/2012 Frequent falls AKI on admission Demand ischemia B12 deficiency DM TY 2 HLD HTN  BUNs/creatinine 24/0.9-->17/0.8 WBC 7.4-->6.4 Hemoglobin 12.2-->11.8 Echocardiogram 4/12 = EF 60-65% grade 1 DD

## 2020-12-09 LAB — CULTURE, BLOOD (SINGLE)
Culture: NO GROWTH
Special Requests: ADEQUATE

## 2021-07-26 DEATH — deceased

## 2022-05-09 IMAGING — MR MR HEAD W/O CM
11 series · 44 of 48 positions shown · non-contrast
Comparison: Prior CT from 12/04/2020.

CLINICAL DATA: Initial evaluation for neuro deficit, stroke
suspected.

EXAM:
MRI HEAD WITHOUT CONTRAST
TECHNIQUE: Multiplanar, multiecho pulse sequences of the brain and surrounding
structures were obtained without intravenous contrast.

[Series 5: ax dwi_tracew · axial · 3.0mm · 0.65mm/px · z∈[-89,+64]mm · 4 of 48 slices shown]
[im 1/48]
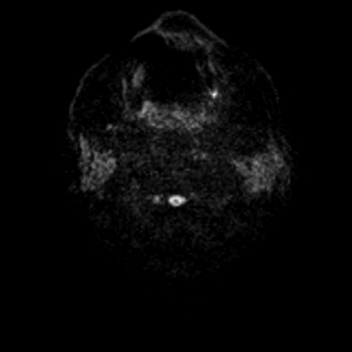
[im 16/48]
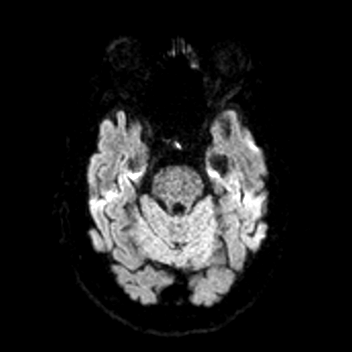
[im 32/48]
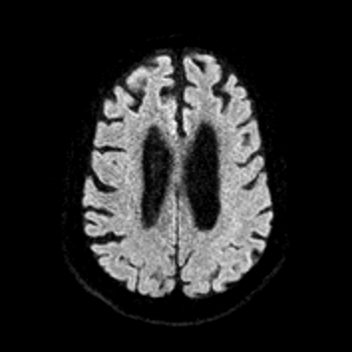
[im 48/48]
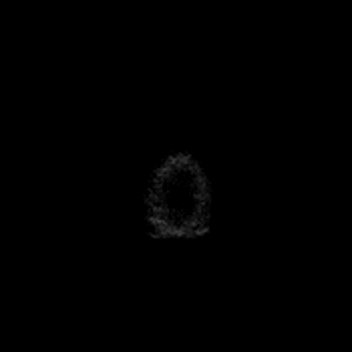

[Series 6: ax dwi_adc · axial · 3.0mm · 0.65mm/px · z∈[-89,+64]mm · 4 of 48 slices shown]
[im 1/48]
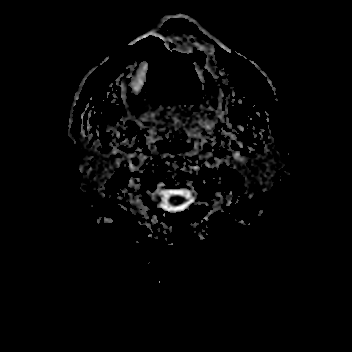
[im 16/48]
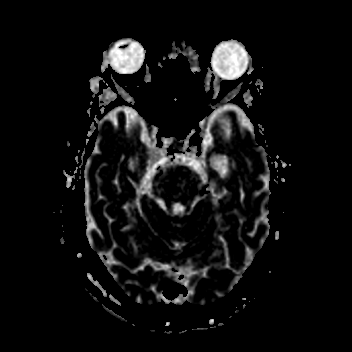
[im 32/48]
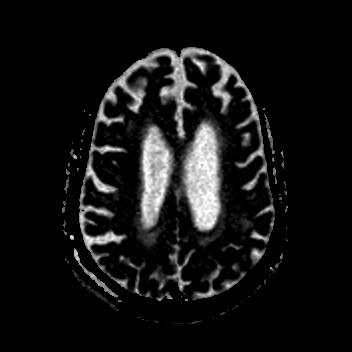
[im 48/48]
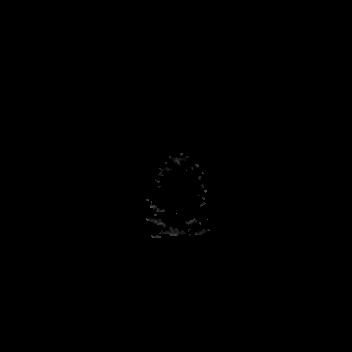

[Series 7: cor dwi_tracew · coronal · 5.0mm · 0.65mm/px · 3 of 40 slices shown]
[im 1/40]
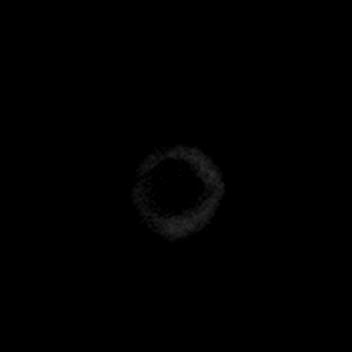
[im 20/40]
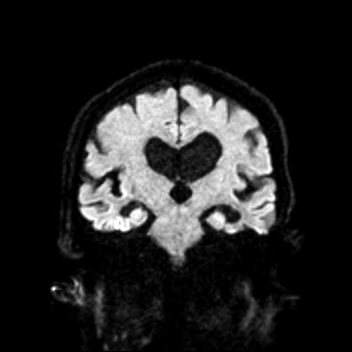
[im 40/40]
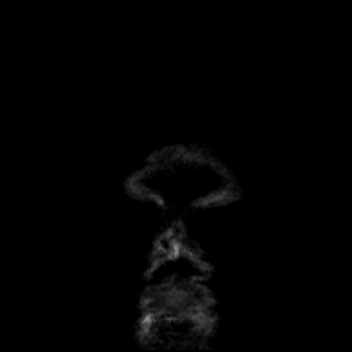

[Series 8: cor dwi_adc · coronal · 5.0mm · 0.65mm/px · 3 of 39 slices shown]
[im 1/39]
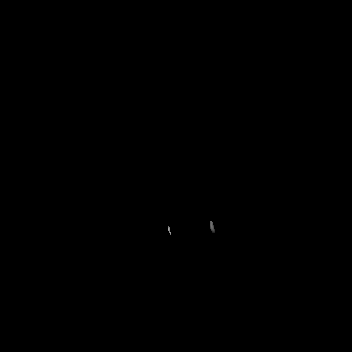
[im 20/39]
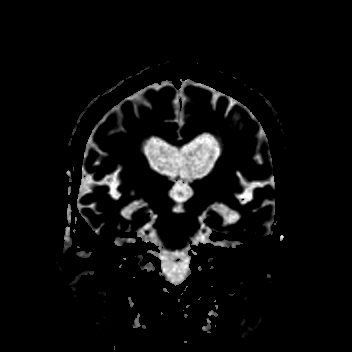
[im 39/39]
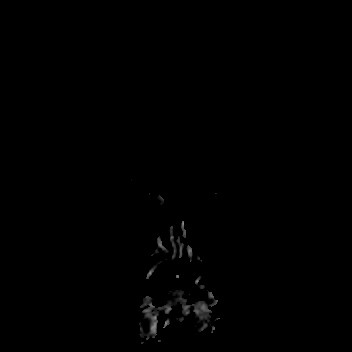

[Series 9: T1 · sagittal · 5.0mm · 0.62mm/px · 2 of 23 slices shown (1 of 2)]
[im 1/23]
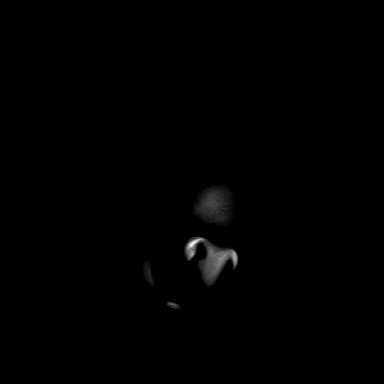
[im 23/23]
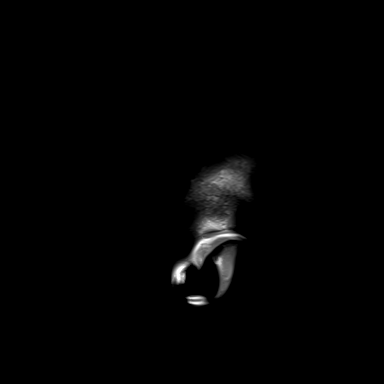

[Series 10: T2 · axial · 5.0mm · 0.53mm/px · z∈[-90,+63]mm · 2 of 27 slices shown (1 of 2)]
[im 1/27]
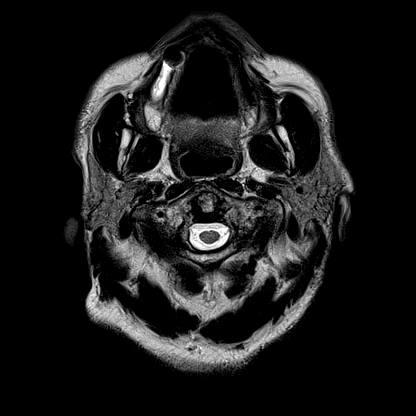
[im 27/27]
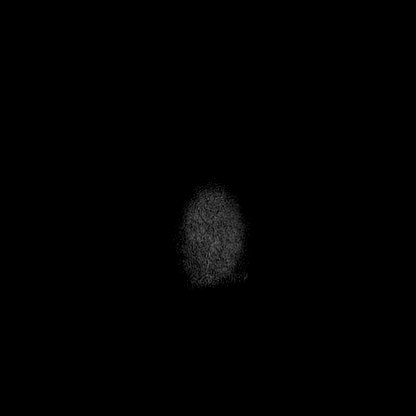

[Series 12: pha_images · axial · 3.0mm · 0.90mm/px · z∈[-100,+71]mm · 5 of 58 slices shown]
[im 1/58]
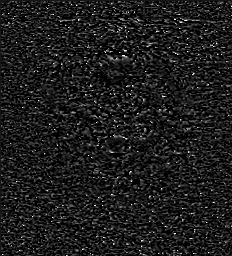
[im 15/58]
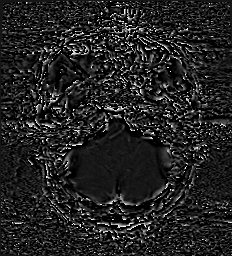
[im 29/58]
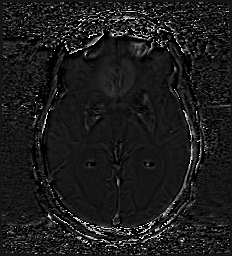
[im 43/58]
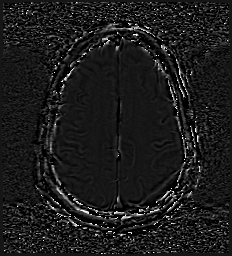
[im 58/58]
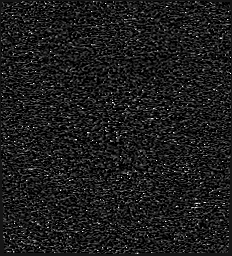

[Series 13: swi_images · axial · 3.0mm · 0.90mm/px · z∈[-100,+74]mm · 5 of 60 slices shown]
[im 1/60]
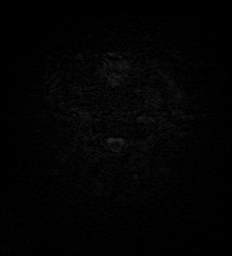
[im 15/60]
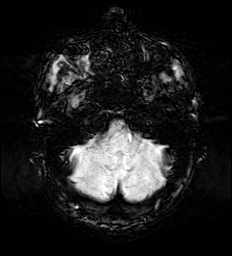
[im 30/60]
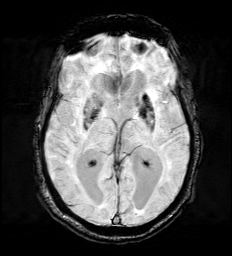
[im 45/60]
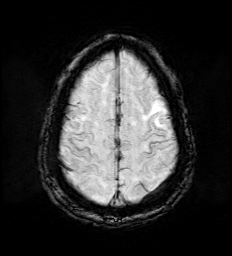
[im 60/60]
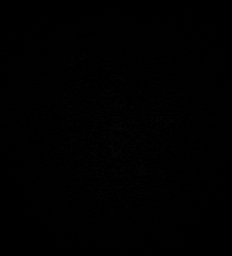

[Series 15: FLAIR · axial · 3.0mm · 0.53mm/px · z∈[-93,+66]mm · 4 of 55 slices shown]
[im 1/55]
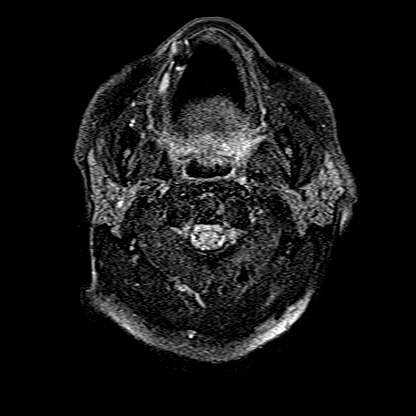
[im 19/55]
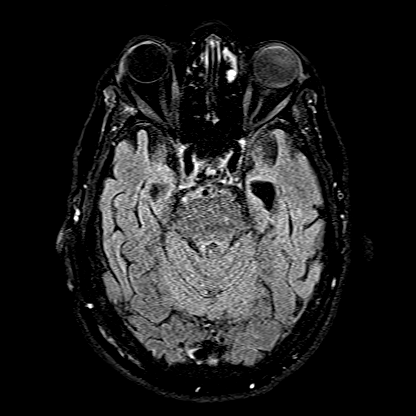
[im 37/55]
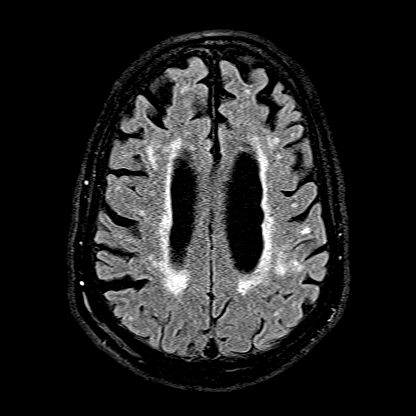
[im 55/55]
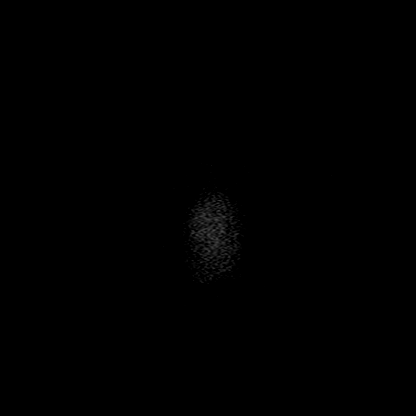

[Series 16: T1 · axial · 1.0mm · 0.98mm/px · z∈[-97,+75]mm · 10 of 176 slices shown (2 of 2)]
[im 1/176]
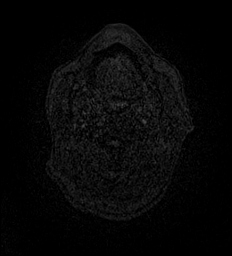
[im 14/176]
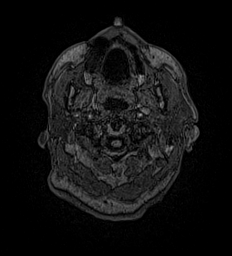
[im 27/176]
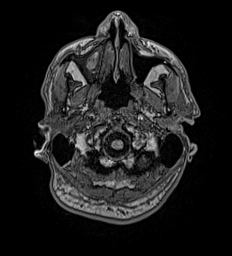
[im 41/176]
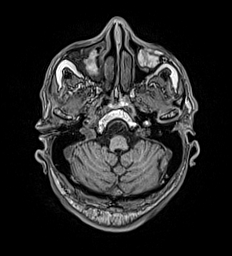
[im 54/176]
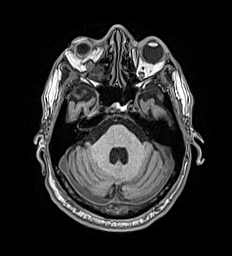
[im 81/176]
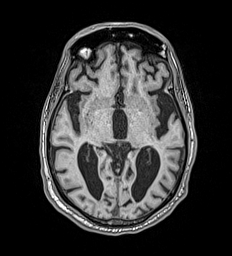
[im 95/176]
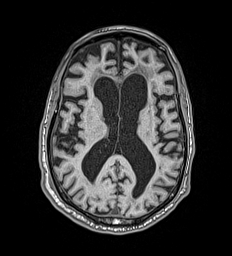
[im 122/176]
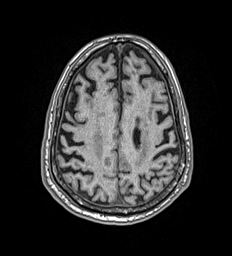
[im 149/176]
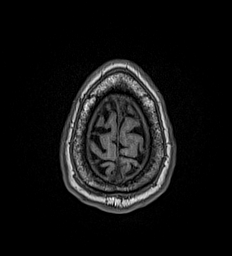
[im 176/176]
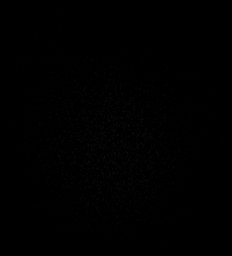

[Series 17: T2 · coronal · 5.0mm · 0.57mm/px · 2 of 31 slices shown (2 of 2)]
[im 1/31]
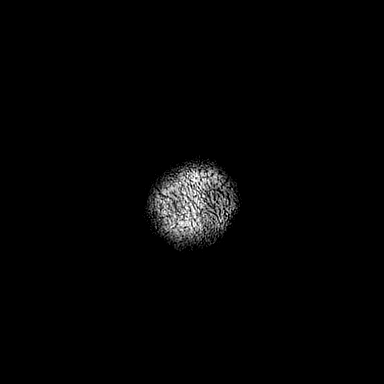
[im 31/31]
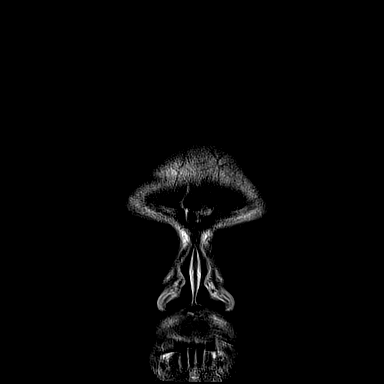

[44 of 48 positions shown; findings below may reference images not displayed]

FINDINGS: Brain: Diffuse prominence of the CSF containing spaces compatible
with generalized cerebral atrophy, moderately advanced in nature,
and most pronounced at the frontotemporal region. Patchy and
confluent T2/FLAIR hyperintensity about the periventricular and deep
white matter both cerebral hemispheres most consistent with chronic
small vessel ischemic disease, moderate in nature. Remote lacunar
infarct with chronic hemosiderin staining present at the left basal
ganglia. Additional chronic lacunar infarct at the left pons. Small
remote left cerebellar infarct. Area of encephalomalacia and gliosis
involving the anterior left frontal lobe consistent with a chronic
left MCA distribution infarct.

No abnormal foci of restricted diffusion to suggest acute or
subacute ischemia. Gray-white matter differentiation otherwise
maintained. No acute intracranial hemorrhage. No other foci of
susceptibility artifact to suggest chronic intracranial hemorrhage.

No mass lesion, midline shift or mass effect. Diffuse ventricular
prominence related to global parenchymal volume loss without
hydrocephalus. No extra-axial fluid collection. Pituitary gland
suprasellar region normal. Midline structures intact.

Vascular: Major intracranial vascular flow voids are maintained.

Skull and upper cervical spine: Craniocervical junction within
normal limits. Bone marrow signal intensity normal. No scalp soft
tissue abnormality.

Sinuses/Orbits: Globes and orbital soft tissues within normal
limits. Moderate mucosal thickening with pneumatized secretions
noted within the right maxillary sinus. Paranasal sinuses are
otherwise largely clear. No mastoid effusion. Inner ear structures
grossly normal.

Other: None.
IMPRESSION: 1. No acute intracranial abnormality.
2. Chronic left MCA distribution infarct, with additional remote
lacunar infarcts involving the left basal ganglia, left pons, and
left cerebellar hemisphere.
3. Underlying moderately advanced cerebral atrophy with chronic
small vessel ischemic disease.
